# Patient Record
Sex: Female | Born: 1955 | Race: White | Hispanic: No | Marital: Married | Smoking: Current every day smoker
Health system: Southern US, Community
[De-identification: ages and names within clinical notes are randomized; demographics above are authoritative.]

## PROBLEM LIST (undated history)

## (undated) DIAGNOSIS — B178 Other specified acute viral hepatitis: Secondary | ICD-10-CM

## (undated) DIAGNOSIS — B2799 Infectious mononucleosis, unspecified with other complication: Secondary | ICD-10-CM

## (undated) DIAGNOSIS — Z8669 Personal history of other diseases of the nervous system and sense organs: Secondary | ICD-10-CM

## (undated) DIAGNOSIS — N809 Endometriosis, unspecified: Secondary | ICD-10-CM

## (undated) DIAGNOSIS — F1121 Opioid dependence, in remission: Secondary | ICD-10-CM

## (undated) HISTORY — DX: Other specified acute viral hepatitis: B17.8

## (undated) HISTORY — PX: BUNIONECTOMY: SHX129

## (undated) HISTORY — DX: Personal history of other diseases of the nervous system and sense organs: Z86.69

## (undated) HISTORY — PX: ABDOMINAL HYSTERECTOMY: SHX81

## (undated) HISTORY — DX: Other specified acute viral hepatitis: B27.99

## (undated) HISTORY — PX: CHOLECYSTECTOMY: SHX55

## (undated) HISTORY — DX: Endometriosis, unspecified: N80.9

## (undated) HISTORY — DX: Opioid dependence, in remission: F11.21

---

## 1997-07-21 ENCOUNTER — Inpatient Hospital Stay (HOSPITAL_COMMUNITY): Admission: EM | Admit: 1997-07-21 | Discharge: 1997-07-23 | Payer: Self-pay | Admitting: *Deleted

## 1997-08-14 ENCOUNTER — Emergency Department (HOSPITAL_COMMUNITY): Admission: EM | Admit: 1997-08-14 | Discharge: 1997-08-14 | Payer: Self-pay

## 1997-08-17 ENCOUNTER — Encounter: Admission: RE | Admit: 1997-08-17 | Discharge: 1997-11-15 | Payer: Self-pay | Admitting: Internal Medicine

## 1997-09-10 ENCOUNTER — Emergency Department (HOSPITAL_COMMUNITY): Admission: EM | Admit: 1997-09-10 | Discharge: 1997-09-10 | Payer: Self-pay | Admitting: Emergency Medicine

## 1997-11-23 ENCOUNTER — Encounter: Admission: RE | Admit: 1997-11-23 | Discharge: 1998-02-21 | Payer: Self-pay | Admitting: Internal Medicine

## 1997-11-30 ENCOUNTER — Encounter: Admission: RE | Admit: 1997-11-30 | Discharge: 1998-02-28 | Payer: Self-pay | Admitting: Internal Medicine

## 1998-01-06 ENCOUNTER — Encounter: Payer: Self-pay | Admitting: Gastroenterology

## 1998-01-06 ENCOUNTER — Inpatient Hospital Stay (HOSPITAL_COMMUNITY): Admission: EM | Admit: 1998-01-06 | Discharge: 1998-01-10 | Payer: Self-pay | Admitting: Gastroenterology

## 1998-01-07 ENCOUNTER — Encounter: Payer: Self-pay | Admitting: Gastroenterology

## 1998-01-08 ENCOUNTER — Encounter: Payer: Self-pay | Admitting: Gynecology

## 1998-02-01 ENCOUNTER — Ambulatory Visit (HOSPITAL_COMMUNITY): Admission: RE | Admit: 1998-02-01 | Discharge: 1998-02-01 | Payer: Self-pay | Admitting: Gynecology

## 1998-03-22 ENCOUNTER — Encounter: Payer: Self-pay | Admitting: Emergency Medicine

## 1998-03-22 ENCOUNTER — Emergency Department (HOSPITAL_COMMUNITY): Admission: EM | Admit: 1998-03-22 | Discharge: 1998-03-22 | Payer: Self-pay | Admitting: Emergency Medicine

## 1998-04-22 ENCOUNTER — Encounter: Admission: RE | Admit: 1998-04-22 | Discharge: 1998-05-06 | Payer: Self-pay | Admitting: *Deleted

## 1998-06-20 ENCOUNTER — Emergency Department (HOSPITAL_COMMUNITY): Admission: EM | Admit: 1998-06-20 | Discharge: 1998-06-20 | Payer: Self-pay | Admitting: Internal Medicine

## 1998-07-28 ENCOUNTER — Emergency Department (HOSPITAL_COMMUNITY): Admission: EM | Admit: 1998-07-28 | Discharge: 1998-07-28 | Payer: Self-pay | Admitting: Emergency Medicine

## 1998-07-28 ENCOUNTER — Encounter: Payer: Self-pay | Admitting: Emergency Medicine

## 1998-10-18 ENCOUNTER — Inpatient Hospital Stay (HOSPITAL_COMMUNITY): Admission: EM | Admit: 1998-10-18 | Discharge: 1998-10-22 | Payer: Self-pay | Admitting: Gastroenterology

## 1998-10-19 ENCOUNTER — Encounter: Payer: Self-pay | Admitting: Gastroenterology

## 1998-10-22 ENCOUNTER — Encounter: Payer: Self-pay | Admitting: Gastroenterology

## 1998-12-07 ENCOUNTER — Encounter (INDEPENDENT_AMBULATORY_CARE_PROVIDER_SITE_OTHER): Payer: Self-pay

## 1998-12-07 ENCOUNTER — Inpatient Hospital Stay (HOSPITAL_COMMUNITY): Admission: RE | Admit: 1998-12-07 | Discharge: 1998-12-09 | Payer: Self-pay | Admitting: *Deleted

## 1999-10-20 ENCOUNTER — Encounter: Admission: RE | Admit: 1999-10-20 | Discharge: 1999-10-20 | Payer: Self-pay | Admitting: *Deleted

## 1999-10-20 ENCOUNTER — Encounter: Payer: Self-pay | Admitting: *Deleted

## 1999-12-02 ENCOUNTER — Encounter: Admission: RE | Admit: 1999-12-02 | Discharge: 1999-12-02 | Payer: Self-pay | Admitting: Family Medicine

## 1999-12-02 ENCOUNTER — Encounter: Payer: Self-pay | Admitting: Family Medicine

## 1999-12-16 ENCOUNTER — Encounter: Payer: Self-pay | Admitting: Gastroenterology

## 1999-12-16 ENCOUNTER — Ambulatory Visit (HOSPITAL_COMMUNITY): Admission: RE | Admit: 1999-12-16 | Discharge: 1999-12-16 | Payer: Self-pay | Admitting: Gastroenterology

## 1999-12-22 ENCOUNTER — Ambulatory Visit (HOSPITAL_COMMUNITY): Admission: RE | Admit: 1999-12-22 | Discharge: 1999-12-22 | Payer: Self-pay | Admitting: Gastroenterology

## 1999-12-22 ENCOUNTER — Encounter: Payer: Self-pay | Admitting: Gastroenterology

## 2000-01-05 ENCOUNTER — Encounter: Payer: Self-pay | Admitting: General Surgery

## 2000-01-05 ENCOUNTER — Encounter (INDEPENDENT_AMBULATORY_CARE_PROVIDER_SITE_OTHER): Payer: Self-pay | Admitting: Specialist

## 2000-01-05 ENCOUNTER — Observation Stay (HOSPITAL_COMMUNITY): Admission: RE | Admit: 2000-01-05 | Discharge: 2000-01-06 | Payer: Self-pay | Admitting: General Surgery

## 2000-03-13 HISTORY — PX: TOTAL VAGINAL HYSTERECTOMY: SHX2548

## 2001-03-08 ENCOUNTER — Inpatient Hospital Stay (HOSPITAL_COMMUNITY): Admission: AD | Admit: 2001-03-08 | Discharge: 2001-03-11 | Payer: Self-pay | Admitting: Psychiatry

## 2001-03-14 ENCOUNTER — Other Ambulatory Visit (HOSPITAL_COMMUNITY): Admission: RE | Admit: 2001-03-14 | Discharge: 2001-04-09 | Payer: Self-pay | Admitting: Psychiatry

## 2001-06-21 ENCOUNTER — Emergency Department (HOSPITAL_COMMUNITY): Admission: EM | Admit: 2001-06-21 | Discharge: 2001-06-21 | Payer: Self-pay | Admitting: *Deleted

## 2001-06-21 ENCOUNTER — Other Ambulatory Visit (HOSPITAL_COMMUNITY): Admission: RE | Admit: 2001-06-21 | Discharge: 2001-07-19 | Payer: Self-pay | Admitting: Psychiatry

## 2001-10-07 ENCOUNTER — Encounter: Admission: RE | Admit: 2001-10-07 | Discharge: 2001-10-07 | Payer: Self-pay | Admitting: Psychiatry

## 2001-10-15 ENCOUNTER — Encounter: Admission: RE | Admit: 2001-10-15 | Discharge: 2001-10-15 | Payer: Self-pay | Admitting: Psychiatry

## 2001-10-29 ENCOUNTER — Encounter: Admission: RE | Admit: 2001-10-29 | Discharge: 2001-10-29 | Payer: Self-pay | Admitting: Psychiatry

## 2001-11-08 ENCOUNTER — Encounter: Admission: RE | Admit: 2001-11-08 | Discharge: 2001-11-08 | Payer: Self-pay | Admitting: *Deleted

## 2001-11-12 ENCOUNTER — Encounter: Admission: RE | Admit: 2001-11-12 | Discharge: 2001-11-12 | Payer: Self-pay | Admitting: Psychiatry

## 2001-11-19 ENCOUNTER — Encounter: Admission: RE | Admit: 2001-11-19 | Discharge: 2001-11-19 | Payer: Self-pay | Admitting: Psychiatry

## 2001-11-26 ENCOUNTER — Encounter: Admission: RE | Admit: 2001-11-26 | Discharge: 2001-11-26 | Payer: Self-pay | Admitting: Psychiatry

## 2001-12-06 ENCOUNTER — Encounter: Admission: RE | Admit: 2001-12-06 | Discharge: 2001-12-06 | Payer: Self-pay | Admitting: Psychiatry

## 2001-12-11 ENCOUNTER — Encounter: Admission: RE | Admit: 2001-12-11 | Discharge: 2001-12-11 | Payer: Self-pay | Admitting: Psychiatry

## 2001-12-17 ENCOUNTER — Encounter: Admission: RE | Admit: 2001-12-17 | Discharge: 2001-12-17 | Payer: Self-pay | Admitting: Psychiatry

## 2005-11-14 ENCOUNTER — Emergency Department (HOSPITAL_COMMUNITY): Admission: EM | Admit: 2005-11-14 | Discharge: 2005-11-14 | Payer: Self-pay | Admitting: Emergency Medicine

## 2005-11-20 ENCOUNTER — Other Ambulatory Visit (HOSPITAL_COMMUNITY): Admission: RE | Admit: 2005-11-20 | Discharge: 2006-02-18 | Payer: Self-pay | Admitting: Psychiatry

## 2005-11-21 ENCOUNTER — Ambulatory Visit: Payer: Self-pay | Admitting: Psychiatry

## 2009-04-27 ENCOUNTER — Ambulatory Visit: Payer: Self-pay | Admitting: Internal Medicine

## 2009-04-27 DIAGNOSIS — F17200 Nicotine dependence, unspecified, uncomplicated: Secondary | ICD-10-CM | POA: Insufficient documentation

## 2009-04-27 DIAGNOSIS — R109 Unspecified abdominal pain: Secondary | ICD-10-CM | POA: Insufficient documentation

## 2009-04-27 DIAGNOSIS — R030 Elevated blood-pressure reading, without diagnosis of hypertension: Secondary | ICD-10-CM | POA: Insufficient documentation

## 2009-06-01 ENCOUNTER — Ambulatory Visit: Payer: Self-pay | Admitting: Internal Medicine

## 2009-06-01 LAB — CONVERTED CEMR LAB
ALT: 25 units/L (ref 0–35)
Albumin: 4.1 g/dL (ref 3.5–5.2)
BUN: 8 mg/dL (ref 6–23)
Basophils Relative: 1.3 % (ref 0.0–3.0)
Bilirubin Urine: NEGATIVE
Chloride: 110 meq/L (ref 96–112)
Cholesterol: 190 mg/dL (ref 0–200)
Eosinophils Relative: 5.3 % — ABNORMAL HIGH (ref 0.0–5.0)
Glucose, Urine, Semiquant: NEGATIVE
HCT: 40.4 % (ref 36.0–46.0)
Hemoglobin: 13.4 g/dL (ref 12.0–15.0)
LDL Cholesterol: 124 mg/dL — ABNORMAL HIGH (ref 0–99)
Lymphs Abs: 1.7 10*3/uL (ref 0.7–4.0)
MCV: 96.2 fL (ref 78.0–100.0)
Monocytes Absolute: 0.3 10*3/uL (ref 0.1–1.0)
Neutro Abs: 2 10*3/uL (ref 1.4–7.7)
Platelets: 164 10*3/uL (ref 150.0–400.0)
Potassium: 4.2 meq/L (ref 3.5–5.1)
RBC: 4.2 M/uL (ref 3.87–5.11)
TSH: 1.22 microintl units/mL (ref 0.35–5.50)
Total Protein: 6.7 g/dL (ref 6.0–8.3)
Urobilinogen, UA: 0.2
WBC: 4.3 10*3/uL — ABNORMAL LOW (ref 4.5–10.5)

## 2009-06-09 ENCOUNTER — Ambulatory Visit: Payer: Self-pay | Admitting: Internal Medicine

## 2009-06-09 DIAGNOSIS — N951 Menopausal and female climacteric states: Secondary | ICD-10-CM | POA: Insufficient documentation

## 2009-06-09 DIAGNOSIS — M5430 Sciatica, unspecified side: Secondary | ICD-10-CM | POA: Insufficient documentation

## 2009-07-26 ENCOUNTER — Telehealth: Payer: Self-pay | Admitting: Internal Medicine

## 2009-09-10 ENCOUNTER — Ambulatory Visit: Payer: Self-pay | Admitting: Internal Medicine

## 2009-11-26 ENCOUNTER — Telehealth: Payer: Self-pay | Admitting: Internal Medicine

## 2009-11-30 ENCOUNTER — Telehealth (INDEPENDENT_AMBULATORY_CARE_PROVIDER_SITE_OTHER): Payer: Self-pay | Admitting: *Deleted

## 2009-12-09 ENCOUNTER — Telehealth: Payer: Self-pay | Admitting: *Deleted

## 2010-01-07 ENCOUNTER — Telehealth: Payer: Self-pay | Admitting: Internal Medicine

## 2010-01-07 ENCOUNTER — Telehealth: Payer: Self-pay | Admitting: *Deleted

## 2010-02-11 ENCOUNTER — Telehealth: Payer: Self-pay | Admitting: *Deleted

## 2010-04-03 ENCOUNTER — Encounter: Payer: Self-pay | Admitting: Emergency Medicine

## 2010-04-12 NOTE — Progress Notes (Signed)
Summary: Pt does not want anymore narcotics. Pls call  Phone Note Call from Patient Call back at Home Phone [phone]   Caller: Patient Summary of Call: Pt called and said that she believes that she is hooked on narcotics and wants it noted in her chart, not to give her anymore narcotics. Pt would like Dr [name] to call.  Initial call taken by: [name],  February 11, 2010 9:52 AM  Follow-up for Phone Call        please call patient    i wont be able to call her today .   ok to note in chart   her request .  Should probably come in to discuss   her medication needs  in the next few week s   and plans ( see my last   advice about assessment  and follow up.)   otherwise  Follow-up by: Madelin Headings MD,  February 11, 2010 1:02 PM  Additional Follow-up for Phone Call Additional follow up Details #1::        Pt aware and will call back to schedule an appt. Additional Follow-up by: Romualdo Bolk, CMA (AAMA),  February 14, 2010 10:30 AM     Appended Document: Pt does not want anymore narcotics. Pls call    Clinical Lists Changes  Allergies: Added new allergy or adverse reaction of * NARCOTICS

## 2010-04-12 NOTE — Progress Notes (Signed)
Summary: please verify patch  Phone Note From Pharmacy   [facility]  [address] [phone] Summary of Call: Estradiol patch was e- prescribed apply patch 2 times a wk instead of one time a week. Please verify Initial call taken by: Heron Sabins,  January 07, 2010 3:02 PM  Follow-up for Phone Call        I called pharmacy and told them that per Dr. [name]- it is twice a week. Follow-up by: Romualdo Bolk, CMA (AAMA),  January 07, 2010 3:21 PM

## 2010-04-12 NOTE — Assessment & Plan Note (Signed)
Summary: CPX/SSC   Vital Signs:  Patient profile:   55 year old female Menstrual status:  hysterectomy Height:      63.5 inches Weight:      149 pounds Pulse rate:   100 / minute BP sitting:   120 / 80  (left arm) Cuff size:   regular  Vitals Entered By: Romualdo Bolk, CMA (AAMA) (June 09, 2009 9:53 AM) CC: CPX   History of Present Illness: Amanda [name] comes  in for her preventive visit . No change since last time .   Has cut down on her tobacco.   Gi taking pepcid    once a day  and helps  her stomach doing well.  BP 120 130    doing much better  and no se of meds. Has hot flushes over the last months   and is problematic and asks  about best rx .  No hx of clots  or heart  signs   has problems with sciatica left back and flares off na on has had oxycodone as needed 10 that she takes infrequently( one refill lasts over a year)   Preventive Care Screening  Prior Values:    Pap Smear:  normal (03/14/2007)    Mammogram:  normal (03/14/2007)    Colonoscopy:  normal (03/13/2000)    Last Tetanus Booster:  Historical (03/14/2007)   Preventive Screening-Counseling & Management  Alcohol-Tobacco     Alcohol drinks/day: 0     Smoking Status: current     Smoking Cessation Counseling: YES     Smoke Cessation Stage: ready     Packs/Day: <0.25  Caffeine-Diet-Exercise     Caffeine use/day: 4-5     Does Patient Exercise: no  Hep-HIV-STD-Contraception     Dental Visit-last 6 months yes     Sun Exposure-Excessive: no  Safety-Violence-Falls     Seat Belt Use: yes     Firearms in the Home: no firearms in the home     Smoke Detectors: yes  EKG  Procedure date:  06/09/2009  Findings:      Normal sinus rhythm with rate of:  66  Current Medications (verified): 1)  Lisinopril 10 Mg Tabs (Lisinopril) .Marland Kitchen.. 1 By Mouth Once Daily  Allergies (verified): 1)  ! Penicillin 2)  Lipitor  Past History:  Past medical, surgical, family and social histories (including risk  factors) reviewed, and no changes noted (except as noted below).  Past Medical History: G2P2  Mono hepatitis Colon 2002   Endometriosis Sciatica left   Past Surgical History: Reviewed history from 04/27/2009 and no changes required. Cholecystectomy Hysterectomy  endometriosis   right oopherectomy   Past History:  Care Management: Gastroenterology: Russella Dar  Family History: Reviewed history from 04/27/2009 and no changes required. Father: Kidney failure, HBP.    vascular disease Mother: Heart disease, DM, heart attack early 66's, HBP, high cholesterol Siblings: Brothers- 1- Chron's and DM MGM  ; Mi 46   Social History: Reviewed history from 04/27/2009 and no changes required. Retired  hhof 4   Occupation: Part-time  BFA Married Current Smoker- 1 pack q 2-4 weeks Alcohol use-no Drug use-no Regular exercise-no  Review of Systems  The patient denies anorexia, fever, weight loss, weight gain, vision loss, decreased hearing, hoarseness, chest pain, syncope, dyspnea on exertion, peripheral edema, headaches, hemoptysis, melena, hematochezia, severe indigestion/heartburn, hematuria, muscle weakness, transient blindness, difficulty walking, depression, unusual weight change, abnormal bleeding, enlarged lymph nodes, angioedema, and breast masses.   Physical Exam General Appearance: well  developed, well nourished, no acute distress Eyes: conjunctiva and lids normal, PERRLA, EOMI,  WNL Ears, Nose, Mouth, Throat: TM clear, nares clear, oral exam WNL Neck: supple, no lymphadenopathy, no thyromegaly, no JVD Respiratory: clear to auscultation and percussion, respiratory effort normal Cardiovascular: regular rate and rhythm, S1-S2, no murmur, rub or gallop, no bruits, peripheral pulses normal and symmetric, no cyanosis, clubbing, edema or varicosities Chest: no scars, masses, tenderness; no asymmetry, skin changes, nipple discharge   Gastrointestinal: soft, non-tender; no  hepatosplenomegaly, masses; active bowel sounds all quadrants,  Genitourinary: hysterectomy Lymphatic: no cervical, axillary or inguinal adenopathy Musculoskeletal: gait normal, muscle tone and strength WNL, no joint swelling, effusions, discoloration, crepitus  some left lbp on heel walking Skin: clear, good turgor, color WNL, no rashes, lesions, or ulcerations Neurologic: normal mental status, normal reflexes, normal strength, sensation, and motion Psychiatric: alert; oriented to person, place and time Other Exam:  EKD nl   see labs     Impression & Recommendations:  Problem # 1:  HEALTH MAINTENANCE EXAM, ADULT (ICD-V70.0)  counseled  about lipids  and lifestyle intervention for cv  risk reduction   Orders: EKG w/ Interpretation (93000)  Problem # 2:  TOBACCO USER (ICD-305.1) Assessment: Improved  encouraged cessation  Encouraged smoking cessation and discussed different methods for smoking cessation.   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 further eval if persistent and progressive  . Her updated medication list for this problem includes:    Hydrocodone-acetaminophen 10-325 Mg/30ml Soln (Hydrocodone-acetaminophen) .Marland Kitchen... 1 by mouth q4-6 hours  as needed severe pain    Hydrocodone-acetaminophen 10-325 Mg Tabs (Hydrocodone-acetaminophen) .Marland Kitchen... 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 Apply patch 2 x per week.  Discussed treatment options.   risk benefit. needs to stop smoking.   Problem # 5:  CORONARY ARTERY DISEASE, FAMILY HX (ICD-V17.3) Assessment: Comment Only lipids are acceptable  but could be better   wprefers no meds  .  stop tobacco  Orders: EKG w/ Interpretation (93000)  Complete Medication List: 1)   Lisinopril 10 Mg Tabs (Lisinopril) .Marland Kitchen.. 1 by mouth once daily 2)  Estraderm 0.05 Mg/24hr Pttw (Estradiol) .... Apply patch 2 x per week. 3)  Hydrocodone-acetaminophen 10-325 Mg/85ml Soln (Hydrocodone-acetaminophen) .Marland Kitchen.. 1 by mouth q4-6 hours  as needed severe pain 4)  Hydrocodone-acetaminophen 10-325 Mg Tabs (Hydrocodone-acetaminophen) .Marland Kitchen.. 1 by mouth q4-6 hours as needed for pain  Patient Instructions: 1)  stop tobacco as we discussed   2)  this is the most effective  way to reduce your cardiovascular risk. 3)  Your lipids are acceptable .  but could be better  .   4)  Stopping tobacco may help. 5)  HRT can help your hot flushes but can increase risk of breast cancer and CV  events very  slightly. 6)  return office visit  in 3 months or prn Prescriptions: HYDROCODONE-ACETAMINOPHEN 10-325 MG TABS (HYDROCODONE-ACETAMINOPHEN) 1 by mouth q4-6 hours as needed for pain  #30 x 0   Entered and Authorized by:   Madelin Headings MD   Signed by:   Madelin Headings MD on 06/09/2009   Method used:   Print then Give to Patient   RxID:   [id number] HYDROCODONE-ACETAMINOPHEN 10-325 MG/15ML SOLN (HYDROCODONE-ACETAMINOPHEN) 1 by mouth q4-6  hours  as needed severe pain  #30 x 0   Entered and Authorized by:   Madelin Headings MD   Signed by:   Madelin Headings MD on 06/09/2009   Method used:   Print then Give to Patient   RxID:   [id number] ESTRADERM 0.05 MG/24HR PTTW (ESTRADIOL) apply patch 2 x per week.  #8 x 5   Entered and Authorized by:   Madelin Headings MD   Signed by:   Madelin Headings MD on 06/09/2009   Method used:   [facility]        [facility] [phone]* (retail)       [address]       [place], [place]  [address]       Ph: [phone] or [phone]       Fax: [phone]   RxID:   [id number]   the above solution rx was an error and was voided.  WKP.

## 2010-04-12 NOTE — Assessment & Plan Note (Signed)
Summary: NEW TO ESTABLISH//SLM   Vital Signs:  Patient profile:   55 year old female Menstrual status:  hysterectomy Height:      63.75 inches Weight:      152 pounds BMI:     26.39 Pulse rate:   84 / minute BP sitting:   180 / 90  (left arm) Cuff size:   regular  Vitals Entered By: Romualdo Bolk, CMA (AAMA) (April 27, 2009 9:23 AM)  Serial Vital Signs/Assessments:  Time      Position  BP       Pulse  Resp  Temp     By           R Arm     170/80                         Madelin Headings MD           L Arm     174/84                         Madelin Headings MD  Comments: reg cuff sitting By: Madelin Headings MD   CC: New Pt to establish- Discuss bp and Pt is having rt sided pain that happens when she eats but also happens at other times as well. It more of a stabbing/ burning pain. It can move to the left side.      Menstrual Status hysterectomy Last PAP Result normal   History of Present Illness: Amanda [name] comesin today for   as a new patient.  She needs PCP .  She was  last seen 2009  Dr Artis Flock. Not on any medication but has seen urgent care [facility]. Bp elevated  there  .   Has arm   Machine checked  out with husbands visit.  She brings in a log o her BP readings over the last few months.  Mostly normal but a few readings elevated. Has never been on BP meds.  Tobacco :  off an on since college   quit for 3 years aroung pregnancy but smoking small amts recently  . No hx of asthma or COPD. or chronic cough  Lipitor use  in the past    has myalgias with this  in the remote past . ? when last checked lipid levels. She has no Cv symptom .   Preventive Care Screening  Last Tetanus Booster:    Date:  03/14/2007    Results:  Historical   Mammogram:    Date:  03/14/2007    Results:  normal   Pap Smear:    Date:  03/14/2007    Results:  normal   Colonoscopy:    Date:  03/13/2000    Results:  normal    Preventive Screening-Counseling &  Management  Alcohol-Tobacco     Alcohol drinks/day: 0     Smoking Status: current     Smoking Cessation Counseling: YES     Packs/Day: <0.25  Caffeine-Diet-Exercise     Caffeine use/day: 4-5     Does Patient Exercise: no  Hep-HIV-STD-Contraception     Dental Visit-last 6 months yes     Sun Exposure-Excessive: no  Safety-Violence-Falls     Seat Belt Use: yes     Firearms in the Home: no firearms in the home     Smoke Detectors: yes      Drug Use:  no.  Current Medications (verified): 1)  None  Allergies (verified): 1)  ! Penicillin 2)  Lipitor  Past History:  Past Medical History: G2P2  Mono hepatitis Colon 2002   Endometriosis  Past Surgical History: Cholecystectomy Hysterectomy  endometriosis   right oopherectomy   Past History:  Care Management: Gastroenterology: Russella Dar  Family History: Father: Kidney failure, HBP.    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    Physical Exam  General:  Well-developed,well-nourished,in no acute distress;  alert,appropriate and cooperative throughout examination Head:  normocephalic, atraumatic, and no abnormalities observed.   Eyes:  vision grossly intact, pupils equal, and pupils round.   Ears:  no external deformities.   Nose:  no external deformity and no external erythema.   Mouth:  good dentition and pharynx pink and moist.   Neck:  No deformities, masses, or tenderness noted. Lungs:  Normal respiratory effort, chest expands symmetrically. Lungs are clear to auscultation, no crackles or wheezes. Heart:  Normal rate and regular rhythm. S1 and S2 normal without gallop, murmur, click, rub or other extra sounds.no lifts.   Abdomen:  Bowel sounds positive,abdomen soft and non-tender without masses, organomegaly or  noted. Msk:  no joint warmth and no redness over joints.   Pulses:  pulses intact without delay   Extremities:  no clubbing cyanosis or edema  Neurologic:  alert & oriented X3 and gait normal.  non focal  Skin:  turgor normal, color normal, no ecchymoses, and no petechiae.   Cervical Nodes:  No lymphadenopathy noted Psych:  Oriented X3, normally interactive, good eye contact, not anxious appearing, and not depressed appearing.   reviewed   Bp log from JUne to  February  one reading 170,  6  readings in 140s and  21  less than 140 systolic   Impression & Recommendations:  Problem # 1:  ELEVATED BLOOD PRESSURE (ICD-796.2) seems to be normal most of the time at home with machine that has been checked as accurate.  ? WCHT but very stong fam hx of HT and end organ disease. counseling about lifestyle intervention  to reduce risk. Her updated medication list for this problem includes:    Lisinopril 10 Mg Tabs (Lisinopril) .Marland Kitchen... 1 by mouth once daily  Problem # 2:  ABDOMINAL PAIN OTHER SPECIFIED SITE (ICD-789.09) episodic and non specific  poss acid peptic related .    will use h2 antagonist for now and follow up .  Problem # 3:  CORONARY ARTERY DISEASE, FAMILY HX (ICD-V17.3) need  risk  assess ment ... impressive family hx    females with heart events in middle age.   Problem # 4:  TOBACCO USER (ICD-305.1) counseled about  d/c and  importance to decrease risk. will readdress in future visits also  Problem # 5:  Preventive Health Care (ICD-V70.0) get mammo   last colon 2002   will eventually need  another .      Problem # 6:  ? of HYPERLIPIDEMIA (ICD-272.4) was on liptor at one time and had se   Complete Medication List: 1)  Lisinopril 10 Mg Tabs (Lisinopril) .Marland Kitchen.. 1 by mouth once daily  Patient Instructions: 1)  Get copy of blood tests done in the last t year . 2)  also bring in Dr Artis Flock record  . 3)  Check Bp readings at least 5 x per week. 4)  Begin lisinopril 10 mg each day  this is a low dose med.  5)  Schedule fasting labs cpx .   6)  then cpx in 1 month ( can make a slot) . 7)  Can take zantac two times a day or pepcid for 2 weeks .  8)  Tobacco is very bad for your health and your loved ones ! You should stop smoking !  9)  lmimt sodium  increase aerobic exercise.  Prescriptions: LISINOPRIL 10 MG TABS (LISINOPRIL) 1 by mouth once daily  #30 x 3   Entered and Authorized by:   Madelin Headings MD   Signed by:   Madelin Headings MD on 04/27/2009   Method used:   [facility]        [facility]  [address] [facility] [address] [phone]* (retail)       [address]       [place], [place]  [address]       Ph: [phone] or [phone]       Fax: [phone]   RxID:   [id number]

## 2010-04-12 NOTE — Assessment & Plan Note (Signed)
Summary: FOLLOW UP/CB   Vital Signs:  Patient profile:   55 year old female Menstrual status:  hysterectomy Weight:      143 pounds Pulse rate:   66 / minute BP sitting:   120 / 70  (left arm) Cuff size:   regular  Vitals Entered By: Romualdo Bolk, CMA (AAMA) (September 10, 2009 10:39 AM) CC: Follow-up visit    History of Present Illness: Amanda [name] comes in for follow up of a couple of issues  and medications.  Hot flushes :  Much better on hormone replacement patch  Tobacco: down to one a day .Marland Kitchen.for a while  and exercising more and able to lose some weight. Pain:  sciatica:   left  med at beach .  and coulnt recover . asks for refill. takes intermittently .  BP doing much better with lifestyle intervention and medication without se. at goal   Preventive Screening-Counseling & Management  Alcohol-Tobacco     Alcohol drinks/day: 0     Smoking Status: current     Smoking Cessation Counseling: YES     Smoke Cessation Stage: ready     Packs/Day: <0.25     Tobacco Counseling: to quit use of tobacco products  Caffeine-Diet-Exercise     Caffeine use/day: 4-5     Does Patient Exercise: no  Comments: encouraged to keep going with quitting tobacco  only on one a day for weeks.  Safety-Violence-Falls     Firearms in the Home: firearms in the home     Firearm Counseling: not indicated; uses recommended firearm safety measures  Comments: hysband in law enforcement  Current Medications (verified): 1)  Lisinopril 10 Mg Tabs (Lisinopril) .Marland Kitchen.. 1 By Mouth Once Daily 2)  Estraderm 0.05 Mg/24hr Pttw (Estradiol) .... Apply Patch 2 X Per Week. 3)  Hydrocodone-Acetaminophen 10-325 Mg Tabs (Hydrocodone-Acetaminophen) .Marland Kitchen.. 1 By Mouth Q4-6 Hours As Needed For Pain  Allergies (verified): 1)  ! Penicillin 2)  Lipitor  Past History:  Past medical, surgical, family and social histories (including risk factors) reviewed, and no changes noted (except as noted below).  Past Medical  History: Reviewed history from 06/09/2009 and no changes required. G2P2  Mono hepatitis Colon 2002   Endometriosis Sciatica left   Past Surgical History: Reviewed history from 04/27/2009 and no changes required. Cholecystectomy Hysterectomy  endometriosis   right oopherectomy   Family History: Reviewed history from 04/27/2009 and no changes required. Father: Kidney failure, HBP.    vascular disease Mother: Heart disease, DM, heart attack early 85's, HBP, high cholesterol Siblings: Brothers- 1- Chron's and DM MGM  ; Mi 19   Social History: Reviewed history from 04/27/2009 and no changes required. Retired  hhof 4   Occupation: Part-time  BFA Married Current Smoker- 1 pack q 2-4 weeks Alcohol use-no Drug use-no Regular exercise-no  Review of Systems  The patient denies anorexia, fever, weight gain, vision loss, chest pain, syncope, dyspnea on exertion, peripheral edema, prolonged cough, hemoptysis, melena, hematochezia, muscle weakness, transient blindness, difficulty walking, depression, abnormal bleeding, enlarged lymph nodes, angioedema, and breast masses.         neg cv pulm , intermittent  swelling behind right knee  no calf swelling remote hx of injury when younger  Physical Exam  General:  Well-developed,well-nourished,in no acute distress; alert,appropriate and cooperative throughout examination Head:  normocephalic, atraumatic, and no abnormalities observed.   Eyes:  vision grossly intact and pupils equal.   Neck:  No deformities, masses, or tenderness noted. Lungs:  Normal respiratory effort, chest expands symmetrically. Lungs are clear to auscultation, no crackles or wheezes. Heart:  Normal rate and regular rhythm. S1 and S2 normal without gallop, murmur, click, rub or other extra sounds.no lifts.   Abdomen:  Bowel sounds positive,abdomen soft and non-tender without masses, organomegaly or  noted. Msk:  swelling behind right kknee in popliteal fossa  no calf  swekling redness  no knee instability obvious today  Pulses:  pulses intact without delay   Extremities:  no clubbing cyanosis or edema  Neurologic:  grossly non focal  Skin:  turgor normal, color normal, no ecchymoses, and no petechiae.   Cervical Nodes:  No lymphadenopathy noted Psych:  Oriented X3, good eye contact, not anxious appearing, and not depressed appearing.     Impression & Recommendations:  Problem # 1:  ELEVATED BLOOD PRESSURE (ICD-796.2) Assessment Improved on meds  Her updated medication list for this problem includes:    Lisinopril 10 Mg Tabs (Lisinopril) .Marland Kitchen... 1 by mouth once daily  BP today: 120/70 Prior BP: 120/80 (06/09/2009)  Labs Reviewed: Creat: 0.8 (06/01/2009) Chol: 190 (06/01/2009)   HDL: 51.40 (06/01/2009)   LDL: 124 (06/01/2009)   TG: 72.0 (06/01/2009)  Instructed in low sodium diet (DASH Handout) and behavior modification.    Problem # 2:  TOBACCO USER (ICD-305.1) down to 1 per day for at least 2 weeks.   continue    doing very well .   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 Apply patch 2 x per week.  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  The following medications were removed from the medication list:    Hydrocodone-acetaminophen 10-325 Mg/5ml Soln (Hydrocodone-acetaminophen) .Marland Kitchen... 1 by mouth q4-6 hours  as needed severe pain Her updated medication list for this problem includes:    Hydrocodone-acetaminophen 10-325 Mg Tabs (Hydrocodone-acetaminophen) .Marland Kitchen... 1 by mouth q4-6 hours as needed for pain  Problem # 5:  ? of POPLITEAL CYST, RIGHT (ICD-727.51) Expectant management may need to see ortho .  Complete Medication  List: 1)  Lisinopril 10 Mg Tabs (Lisinopril) .Marland Kitchen.. 1 by mouth once daily 2)  Estraderm 0.05 Mg/24hr Pttw (Estradiol) .... Apply patch 2 x per week. 3)  Hydrocodone-acetaminophen 10-325 Mg Tabs (Hydrocodone-acetaminophen) .Marland Kitchen.. 1 by mouth q4-6 hours as needed for pain  Patient Instructions: 1)  continue    medication 2)  continue tobacco   cessation.  3)  ROV  with cpx with labs   Feb 2012. 4)  If  swelling in   knee getting worse see ortho . this could be  Bakers Cyst. Prescriptions: HYDROCODONE-ACETAMINOPHEN 10-325 MG TABS (HYDROCODONE-ACETAMINOPHEN) 1 by mouth q4-6 hours as needed for pain  #30 x 1   Entered and Authorized by:   Madelin Headings MD   Signed by:   Madelin Headings MD on 09/10/2009   Method used:   Print then Give to Patient   RxID:   [id number]

## 2010-04-12 NOTE — Progress Notes (Signed)
Summary: refill on hydrocodone  Phone Note Call from Patient Call back at [phone]   Caller: Patient Summary of Call: Pt walked in the office this am stating that she left her hydrocodone at the beach. She is now leaving to go out of town this am and needs a refill on this now because she is going [place] of town to [place]. Pt would like to [facility]. Call pt once it has been called in. Initial call taken by: Romualdo Bolk, CMA (AAMA),  Jul 26, 2009 10:26 AM  Follow-up for Phone Call        Pt called in to adv that msg that she left earlier concerning a refill can be disregarded.  Follow-up by: Debbra Riding,  Jul 26, 2009 11:32 AM

## 2010-04-12 NOTE — Progress Notes (Signed)
Summary: Pt req refill of Hydrocodone to [facility] [address]  Phone Note Refill Request Call back at [facility] Phone [phone]   Refills Requested: Medication #1:  HYDROCODONE-ACETAMINOPHEN 10-325 MG TABS 1 by mouth q4-6 hours as needed for pain.   Dosage confirmed as above?Dosage Confirmed Pt in a lot of pain. Req early refill. Pls call in to [place] [address].       Method Requested: Telephone to Pharmacy Initial call taken by: [name],  December 09, 2009 10:45 AM  Follow-up for Phone Call        please assess her pain status and cause  .and   if getting worse then  may need OV.  ( we may need to  change her rx.  refill x 1  l Follow-up by: Madelin Headings MD,  December 09, 2009 11:42 AM  Additional Follow-up for Phone Call Additional follow up Details #1::        Spoke to pt and she states that the pain comes and goes. It is coming more frequent now. She hasn't seen ortho for this but will call them to see if they can get her in this week.  Rx called in for 40 tabs per Dr. [name] Additional Follow-up by: Romualdo Bolk, CMA [name],  December 09, 2009 12:00 PM    Prescriptions: HYDROCODONE-ACETAMINOPHEN 10-325 MG TABS (HYDROCODONE-ACETAMINOPHEN) 1 by mouth q4-6 hours as needed for pain  #40 x 0   Entered by:   Romualdo Bolk, CMA (AAMA)   Authorized by:   Madelin Headings MD   Signed by:   Romualdo Bolk, CMA (AAMA) on 12/09/2009   Method used:   Telephoned to ...       [facility] [address] [phone]* (retail)       [address]       [place], [place]  [address]       Ph: [phone] or [phone]       Fax: [phone]   RxID:   [id number]

## 2010-04-12 NOTE — Progress Notes (Signed)
Summary: Change Estraderm  Phone Note From Pharmacy   [facility]  [address] [phone]* Summary of Call: Pollie Meyer 0.05mg  is no longer made. Pharmacy is requesting a change to a different patch. Initial call taken by: Romualdo Bolk, CMA [name],  January 07, 2010 8:13 AM  Follow-up for Phone Call        ok to refill  with generic    estradiol patch .05   can refill until  due for check up in July  Follow-up by: Madelin Headings MD,  January 07, 2010 11:49 AM  Additional Follow-up for Phone Call Additional follow up Details #1::        Rx sent to pharmacy. Additional Follow-up by: Romualdo Bolk, CMA (AAMA),  January 07, 2010 12:43 PM    New/Updated Medications: ESTRADIOL 0.05 MG/24HR PTWK (ESTRADIOL) apply patch 2 times a week Prescriptions: ESTRADIOL 0.05 MG/24HR PTWK (ESTRADIOL) apply patch 2 times a week  #8 x 3   Entered by:   Romualdo Bolk, CMA (AAMA)   Authorized by:   Madelin Headings MD   Signed by:   Romualdo Bolk, CMA (AAMA) on 01/07/2010   Method used:   [facility]        [facility]  [address] [phone]* (retail)       [address]       [place], [place]  [address]       Ph: [phone] or [phone]       Fax: [phone]   RxID:   [id number]

## 2010-04-12 NOTE — Progress Notes (Signed)
Summary: hydroco refill/rx diff drugstore  Phone Note Call from Patient Call back at Home Phone [phone]   Call For: Madelin Headings MD Summary of Call: Refill pain med needed for travel to [facility] where cousinn has died auto accident.  Hydrocodone.  [facility] [address].  Need to leave ASAP.   Initial call taken by: Rudy Jew, RN,  November 26, 2009 9:15 AM  Follow-up for Phone Call        ok to refill x 1  Follow-up by: Madelin Headings MD,  November 26, 2009 11:08 AM  Additional Follow-up for Phone Call Additional follow up Details #1::        Phone Call Completed.  Patient called to check on this.   Refill x1 only called in.   Additional Follow-up by: Rudy Jew, RN,  November 26, 2009 11:30 AM    Prescriptions: HYDROCODONE-ACETAMINOPHEN 10-325 MG TABS (HYDROCODONE-ACETAMINOPHEN) 1 by mouth q4-6 hours as needed for pain  #30 x 0   Entered by:   Rudy Jew, RN   Authorized by:   Madelin Headings MD   Signed by:   Rudy Jew, RN on 11/26/2009   Method used:   Telephoned to ...       [facility] [address] [phone]* (retail)       [address]       [place], [place]  [address]       Ph: [phone] or [phone]       Fax: [phone]   RxID:   [id number] HYDROCODONE-ACETAMINOPHEN 10-325 MG TABS (HYDROCODONE-ACETAMINOPHEN) 1 by mouth q4-6 hours as needed for pain  #30 x 1   Entered by:   Rudy Jew, RN   Authorized by:   Madelin Headings MD   Signed by:   Rudy Jew, RN on 11/26/2009   Method used:   Telephoned to ...       [facility] [address] [phone]* (retail)       [address]       [place], [place]  [address]       Ph: [phone] or [phone]       Fax: [phone]   RxID:   [id number]

## 2010-04-12 NOTE — Progress Notes (Signed)
Summary: List of Blood Pressures taken at home  List of Blood Pressures taken at home   Imported By: Maryln Gottron 05/03/2009 15:08:39  _____________________________________________________________________  External Attachment:    Type:   Image     Comment:   External Document

## 2010-04-12 NOTE — Progress Notes (Signed)
Summary: needs refill  Phone Note Call from Patient Call back at Home Phone [phone]   Caller: Patient Call For: Madelin Headings MD Complaint: Urinary/GYN Problems Summary of Call: Pt stated she has no refills on hydrocodone please call pharm [facility] [address] Initial call taken by: Heron Sabins,  November 30, 2009 9:09 AM  Follow-up for Phone Call        Called pharmacy and pt picked up RX on 11/26/09. I informed patient and she states she wants the refill now and I informed her that she will need to wait for 1 month to get the refill. Follow-up by: Josph Macho RMA,  November 30, 2009 9:54 AM

## 2010-07-29 NOTE — Discharge Summary (Signed)
[facility]  Patient:    Amanda [name], Amanda [name] Visit Number: [id number] MRN: [id number]          Service Type: PSY Location: [facility] Attending Physician:  Rachael Fee Dictated by:   Jeanice Lim, M.D. Admit Date:  03/14/2001 Discharge Date: 04/09/2001                             Discharge Summary  IDENTIFYING DATA:  This is a 55 year old Caucasian female voluntarily admitted requesting detox from opiates, unable to stop Tussionex as an outpatient, has a history of 7 years of sobriety and a 2 year period of escalating dose of Vicodin and Tussionex.  No past psychiatric history.  MEDICATIONS:  None.  DRUG ALLERGIES:  PENICILLIN.  PHYSICAL EXAMINATION:  Unremarkable.  NEUROLOGICAL:  Neurologically within normal limits.  MENTAL STATUS EXAM:  Fully alert, cooperative female.  Speech within normal limits.  Mood mildly anxious.  Affect restricted.  Thought processes goal directed.  Thought content negative for suicidal or homicidal ideation or psychotic symptoms.  Cognitively intact.  ADMITTING DIAGNOSES: Axis I:    Opiate dependence. Axis II:   None. Axis III:  None. Axis IV:   Moderate problems of primary support. Axis V:    38/76.  HOSPITAL COURSE:  The patient was admitted for opiate detox, put on Wytensin detox protocol.  The patient tolerated initial protocol without difficulty and was agreeable for outpatient treatment for further observation, withdrawal and substance abuse treatment.  The patient initially felt cramping, anxiety and dysphoria, but tolerated detox without complications.  Her condition at discharge is markedly improved.  There is no evidence of acute withdrawal. Mood was euthymic.  Affect bright.  Thought processes goal directed.  Thought content negative for for dangerous ideation or psychotic symptoms.  The patient reportedly motivation to be compliant with substance abuse treatment and abstinence and was discharged on  Anaprox two q.12h. p.r.n. pain, to avoid all opiates and attend NA and follow up with [facility] on 12/31 at 4 p.m.  DISCHARGE DIAGNOSES: Axis I:    Opiate dependence. Axis II:   None. Axis III:  None. Axis IV:   Moderate problems of primary support. Axis V:    Global Assessment of Functioning on discharge was 50. Dictated by:   Jeanice Lim, M.D. Attending Physician:  Rachael Fee DD:  05/22/01 TD:  05/24/01 Job: [id number]

## 2010-07-29 NOTE — Op Note (Signed)
[facility]  Patient:    Amanda [name], Amanda [name]                      MRN: [id number] Proc. Date: 01/05/00 Adm. Date:  [id number] Attending:  Brandy Hale CC:         Venita Lick. Pleas Koch., M.D. [facility] C. Andrey Campanile, M.D.   Operative Report  PREOPERATIVE DIAGNOSIS:  Biliary colic, suspect cholecystitis.  POSTOPERATIVE DIAGNOSIS:  Biliary colic, suspect cholecystitis.  OPERATION:  Laparoscopic cholecystectomy with intraoperative cholangiogram.  SURGEON:  Angelia Mould. Derrell Lolling, M.D.  FIRST ASSISTANT:  Currie Paris, M.D.  OPERATIVE INDICATIONS:  This is a 55 year old white female who began having abdominal pain problems two years ago.  Since that time she has had intermittent episodes of severe right upper quadrant pain radiating to her back with nausea.  She has been hospitalized at least twice for this and had extensive workup, which has not been very revealing.  She did have an ovarian cyst.  She did have an abdominal hysterectomy and right salpingo-oophorectomy in September 2000, and she did well after that surgery with resolution of her pelvic pain, but her right upper quadrant pain has persisted.  For the past three weeks she has been having daily right upper quadrant pain radiating to her back, nausea, anorexia, and a 10-pound weight loss.  She has had an extensive workup by Dr. Russella Dar, including numerous x-rays, numerous endoscopies and colonoscopies and an MRCP, all of which have been normal.  She has not responded to proton pump inhibitors.  She was sent to me by Dr. Claudette Head for consideration of empiric cholecystectomy.  She is brought to the operating room for diagnostic laparoscopy and cholecystectomy.  OPERATIVE FINDINGS:  The gallbladder was not acutely inflamed, but there were extensive adhesions to the gallbladder, suggesting the possibility of prior inflammatory episodes.  The liver appeared healthy.  The anatomy of the  cystic duct and common bile duct and intrahepatic ducts and ampulla of Vater were normal by cholangiography.  There was no filling defect in the cholangiogram, and there was no sign of obstruction.  The stomach and duodenum appeared normal.  The large intestine and small intestine were grossly normal.  There were no intra-abdominal or peritoneal adhesions that I could detect.  DESCRIPTION OF PROCEDURE:  Following the induction of general endotracheal anesthesia, the patients abdomen was prepped and draped in a sterile fashion. Marcaine 0.5% with epinephrine was used as a local infiltration anesthetic.  A transverse incision was made at the lower limb of the umbilicus, through the previous laparoscopy scar.  The fascia was incised in the midline, the abdominal cavity entered under direct vision.  A 10 mm Hasson trocar was inserted and secured with a pursestring suture of 0 Vicryl.  Pneumoperitoneum was created.  A video camera was inserted with visualization and findings as described above.  A 10 mm trocar was placed in the subxiphoid region and two 5 mm trocars placed in the right midabdomen.  The gallbladder was elevated, placed on traction and a fairly extensive lysis of adhesions of omentum from the gallbladder.  We were able to dissect down and identify the infundibulum of the gallbladder, the cystic duct, and the cystic artery.  A cholangiogram was obtained through the cystic duct and showed normal intrahepatic and extrahepatic bile ducts, no evidence of filling defect, and prompt flow of contrast into the duodenum.  Cholangiogram catheter was removed.  The cystic duct was  secured with metal clips and divided.  The cystic artery was secured with metal clips and divided.  The gallbladder was dissected from its bed with electrocautery and removed through the umbilical port.  The operative field and subphrenic space were copiously irrigated with saline.  At the completion of the case,  there was no bleeding and no bile leak whatsoever.  The trocars were removed under direct vision, and there was no bleeding at the trocar sites.  The pneumoperitoneum was released.  The fascia of the umbilicus was closed with 0 Vicryl suture.  The skin incisions were closed with subcuticular sutures of 4-0 Vicryl and Steri-Strips.  Clean bandages were placed and the patient taken to the recovery room in stable condition.  Estimated blood loss was about 10 cc.  Complications:  None.  Sponge, needle, and instrument counts were correct. DD:  01/05/00 TD:  01/05/00 Job: [id number]

## 2010-10-08 ENCOUNTER — Other Ambulatory Visit: Payer: Self-pay | Admitting: Internal Medicine

## 2010-11-02 ENCOUNTER — Telehealth: Payer: Self-pay | Admitting: Internal Medicine

## 2010-11-02 MED ORDER — LISINOPRIL 10 MG PO TABS: 10.0000 mg | ORAL_TABLET | Freq: Every day | ORAL | Status: DC

## 2010-11-02 NOTE — Telephone Encounter (Signed)
rx sent to pharmacy

## 2010-11-02 NOTE — Telephone Encounter (Signed)
Pt needs a refill on her Lisinopril 10mg . [facility] [address] She has a cpx appt already made.

## 2011-01-25 ENCOUNTER — Other Ambulatory Visit: Payer: Self-pay

## 2011-01-30 ENCOUNTER — Encounter: Payer: Self-pay | Admitting: Internal Medicine

## 2011-05-11 ENCOUNTER — Other Ambulatory Visit: Payer: Self-pay | Admitting: Internal Medicine

## 2011-07-30 ENCOUNTER — Other Ambulatory Visit: Payer: Self-pay | Admitting: Internal Medicine

## 2011-10-19 ENCOUNTER — Other Ambulatory Visit: Payer: Self-pay | Admitting: Internal Medicine

## 2011-10-20 ENCOUNTER — Ambulatory Visit: Payer: Self-pay | Admitting: Internal Medicine

## 2011-10-20 ENCOUNTER — Telehealth: Payer: Self-pay | Admitting: Family Medicine

## 2011-10-20 DIAGNOSIS — R030 Elevated blood-pressure reading, without diagnosis of hypertension: Secondary | ICD-10-CM

## 2011-10-20 NOTE — Telephone Encounter (Signed)
Patient is concerned with retaining fluids.   Yesterday she had elevated blood pressure 150/89, felt weak, and she is under a lot of stress.  The fluid is mainly in her hands and some in her legs.  She would like to know if she should start a Rx for fluid?  She denies any SOB or chest pain.

## 2011-10-20 NOTE — Telephone Encounter (Signed)
Amanda [name],  Patient was on WP's schedule but she wanted it triaged. Please see triage not below from CAN - appt still stands. Thanks.

## 2011-10-20 NOTE — Telephone Encounter (Signed)
Pt is sch for cpx  11-15-2011 and cpx labs 11-06-2011

## 2011-10-20 NOTE — Telephone Encounter (Signed)
Caller: Norma/Other; PCP: Dianna Limbo; CB#: [phone]; ; ; Call regarding Elevated BP. Office Wants Her To Talk With A. Nurse Now. They Did Schedule Her an Appointment But Needed Her To Speak With Someone Before; Onset 10/19/11 Patient states she has a "spell" BP 150/89 "I just felt weak and able to speak."  Under alot of stress and working more.  All emergent symptoms ruled out per Hypertension, Diagnosed or Suspected protocol with exception " Diagnosed hypertension, has had a sudden elevation of blood pressure and new swelling around the eyes, smoky, cola-colored or dark urine, or swelling of the extremities."  Appointment has been schedukled  for 11:15 today 10/20/11.  Patient states she will keep that appointment.

## 2011-10-20 NOTE — Telephone Encounter (Signed)
Per Dr [name] - start Lisinopril 10/12.3 mg daily.  Today's appointment has been cancelled.  CPX to be scheduled for 11/15/11 with labs one week earlier.  Patient is aware of new medication and labs ordered.

## 2011-10-23 MED ORDER — LISINOPRIL-HYDROCHLOROTHIAZIDE 10-12.5 MG PO TABS: 1.0000 | ORAL_TABLET | Freq: Every day | ORAL | Status: DC

## 2011-11-06 ENCOUNTER — Other Ambulatory Visit: Payer: Self-pay

## 2011-11-15 ENCOUNTER — Encounter: Payer: Self-pay | Admitting: Internal Medicine

## 2012-01-11 ENCOUNTER — Other Ambulatory Visit: Payer: Self-pay | Admitting: Internal Medicine

## 2012-01-23 ENCOUNTER — Encounter: Payer: Self-pay | Admitting: Internal Medicine

## 2012-01-23 ENCOUNTER — Ambulatory Visit (INDEPENDENT_AMBULATORY_CARE_PROVIDER_SITE_OTHER): Payer: 59 | Admitting: Internal Medicine

## 2012-01-23 VITALS — BP 126/82 | HR 85 | Temp 97.8°F | Wt 143.0 lb

## 2012-01-23 DIAGNOSIS — F17200 Nicotine dependence, unspecified, uncomplicated: Secondary | ICD-10-CM

## 2012-01-23 DIAGNOSIS — Z833 Family history of diabetes mellitus: Secondary | ICD-10-CM

## 2012-01-23 DIAGNOSIS — R030 Elevated blood-pressure reading, without diagnosis of hypertension: Secondary | ICD-10-CM

## 2012-01-23 DIAGNOSIS — Z23 Encounter for immunization: Secondary | ICD-10-CM

## 2012-01-23 DIAGNOSIS — Z8249 Family history of ischemic heart disease and other diseases of the circulatory system: Secondary | ICD-10-CM

## 2012-01-23 DIAGNOSIS — N809 Endometriosis, unspecified: Secondary | ICD-10-CM

## 2012-01-23 DIAGNOSIS — I10 Essential (primary) hypertension: Secondary | ICD-10-CM

## 2012-01-23 NOTE — Patient Instructions (Signed)
Get labs this week   Keep appt   In January.

## 2012-01-23 NOTE — Progress Notes (Signed)
Chief Complaint  Patient presents with  . Follow-up    Med check    HPI: Pt comes in for medication management is overdue for visit last one was 2011 and she is still on her  lsiinopril for this. States it is controlled and has no se f med such as cramps .  No  Cp sob;   bp has been   128  Range     exercises treadmill   Tobacco 3-4 per day tried to stop.  Caffiene   3-4 per day  decaff No etoh.  ROS: See pertinent positives and negatives per HPI.No Record has not been abstracted reviewed centricity summary sheet Family History  Problem Relation Age of Onset  . Diabetes Mother   . Heart disease Mother 58    heart attack in 41s   . Hyperlipidemia Mother   . Hypertension Mother   . Hypertension Father   . Kidney disease Father   . Vascular Disease Father   . Diabetes Brother   . Crohn's disease Brother   . Heart disease Maternal Grandmother 81    MI age 37     G2P2  Mono hepatitis  Colon 2002  Endometriosis  Sciatica left  Hx narcotic dependence under rx see ed visit 2007  Past Surgical History:  Cholecystectomy  Hysterectomy endometriosis right oopherectomy  Family History:   Father: Kidney failure, HBP. vascular disease  Mother: Heart disease, DM, heart attack early 27's, HBP, high cholesterol  Siblings: Brothers- 1- Chron's and DM  MGM ; Mi 46   History   Social History  . Marital Status: Married    Spouse Name: N/A    Number of Children: N/A  . Years of Education: N/A   Social History Main Topics  . Smoking status: Current Every Day Smoker  . Smokeless tobacco: None  . Alcohol Use: Yes  . Drug Use: None  . Sexually Active: None   Other Topics Concern  . None   Social History Narrative   hh of 2 married Tobacco No etoh3-4 caffiene.Self employed G2P2   Outpatient Encounter Prescriptions as of 01/23/2012  Medication Sig Dispense Refill  . lisinopril-hydrochlorothiazide (PRINZIDE,ZESTORETIC) 10-12.5 MG per tablet TAKE 1 TABLET BY MOUTH DAILY.  30  tablet  0  . [DISCONTINUED] lisinopril (PRINIVIL,ZESTRIL) 10 MG tablet TAKE 1 TABLET BY MOUTH EVERY DAY  30 tablet  0     EXAM:  BP 126/82  Pulse 85  Temp 97.8 F (36.6 C) (Oral)  Wt 143 lb (64.864 kg)  SpO2 98%  There is no height on file to calculate BMI.  GENERAL: vitals reviewed and listed above, alert, oriented, appears well hydrated and in no acute distress  HEENT: atraumatic, conjunctiva  clear, no obvious abnormalities on inspection of external nose and ears OP : no lesion edema or exudate   NECK: no obvious masses on inspection palpation   LUNGS: clear to auscultation bilaterally, no wheezes, rales or rhonchi, good air movement  CV: HRRR, no clubbing cyanosis or  peripheral edema nl cap refill  Abdomen:  Sof,t normal bowel sounds without hepatosplenomegaly, no guarding rebound or masses no CVA tenderness  MS: moves all extremities without noticeable focal  abnormality  PSYCH: pleasant and cooperative, no obvious depression or anxiety verbal and in a rush to an appt. Good eye contact.   ASSESSMENT AND PLAN:  Discussed the following assessment and plan:  1. Hypertension    on acei needs monitoring labs  2. Need for prophylactic vaccination and  inoculation against influenza   3. TOBACCO USER    small amount advise dc disc   4. ELEVATED BLOOD PRESSURE    apparently controlled  needs lab monitoring   5. Family history of heart attack    mom and mgm  6. Family history of diabetes mellitus    mom and brother  6. Endometriosis   disc about above encouraged to dc  Tobacco  Continue same meds  Over due for HCM PV and monitoring.  Record revview apparently on  sobaxone  Info not disc with pt today . Will update at her PV.  -Patient advised to return or notify health care team  immediately if symptoms worsen or persist or new concerns arise.  Patient Instructions  Get labs this week   Keep appt   In January.      Neta Mends. Panosh M.D.

## 2012-01-26 ENCOUNTER — Other Ambulatory Visit (INDEPENDENT_AMBULATORY_CARE_PROVIDER_SITE_OTHER): Payer: 59

## 2012-01-26 DIAGNOSIS — R030 Elevated blood-pressure reading, without diagnosis of hypertension: Secondary | ICD-10-CM

## 2012-01-26 LAB — LIPID PANEL
Cholesterol: 211 mg/dL — ABNORMAL HIGH (ref 0–200)
HDL: 56.6 mg/dL (ref >39.00)
Triglycerides: 68 mg/dL (ref 0.0–149.0)

## 2012-01-26 LAB — CBC
HCT: 38.4 % (ref 36.0–46.0)
Hemoglobin: 12.8 g/dL (ref 12.0–15.0)
Platelets: 165 10*3/uL (ref 150.0–400.0)
RBC: 4.24 Mil/uL (ref 3.87–5.11)
RDW: 12.9 % (ref 11.5–14.6)
WBC: 3.7 10*3/uL — ABNORMAL LOW (ref 4.5–10.5)

## 2012-01-26 LAB — HEPATIC FUNCTION PANEL
ALT: 20 U/L (ref 0–35)
Bilirubin, Direct: 0.1 mg/dL (ref 0.0–0.3)
Total Bilirubin: 0.7 mg/dL (ref 0.3–1.2)

## 2012-01-26 LAB — BASIC METABOLIC PANEL
CO2: 27 mEq/L (ref 19–32)
Calcium: 9.1 mg/dL (ref 8.4–10.5)
Chloride: 102 mEq/L (ref 96–112)
Sodium: 136 mEq/L (ref 135–145)

## 2012-01-26 LAB — POCT URINALYSIS DIPSTICK
Bilirubin, UA: NEGATIVE
Blood, UA: NEGATIVE
Glucose, UA: NEGATIVE
Ketones, UA: NEGATIVE
Spec Grav, UA: 1.015
pH, UA: 6

## 2012-01-28 ENCOUNTER — Encounter: Payer: Self-pay | Admitting: Internal Medicine

## 2012-01-28 DIAGNOSIS — Z8249 Family history of ischemic heart disease and other diseases of the circulatory system: Secondary | ICD-10-CM | POA: Insufficient documentation

## 2012-01-28 DIAGNOSIS — Z833 Family history of diabetes mellitus: Secondary | ICD-10-CM | POA: Insufficient documentation

## 2012-01-28 DIAGNOSIS — N809 Endometriosis, unspecified: Secondary | ICD-10-CM | POA: Insufficient documentation

## 2012-01-29 ENCOUNTER — Telehealth: Payer: Self-pay | Admitting: Internal Medicine

## 2012-01-29 NOTE — Telephone Encounter (Signed)
Pt is aware lab work not back

## 2012-01-29 NOTE — Telephone Encounter (Signed)
Pt called req to get lab results.  °

## 2012-01-30 ENCOUNTER — Other Ambulatory Visit: Payer: Self-pay | Admitting: Family Medicine

## 2012-01-30 MED ORDER — LISINOPRIL-HYDROCHLOROTHIAZIDE 10-12.5 MG PO TABS: 1.0000 | ORAL_TABLET | Freq: Every day | ORAL | Status: DC

## 2012-01-30 NOTE — Telephone Encounter (Signed)
Potassium is borderline low rest is ok  Increase potassium rich foods  No change in meds  Will repeat level at her check up.   Please refill her medication for 90 days  thanks

## 2012-01-30 NOTE — Telephone Encounter (Signed)
Please give lab results.

## 2012-01-30 NOTE — Telephone Encounter (Signed)
Pt calling back to check on lab results. I informed her that I do see lab results in EPIC, but that they need to be reviewed by Dr. [name]. Pt states that she has 2 days left of BP meds, but does not want to run out. She is not sure if Dr. [name] will change the med or dosage of the BP med based on lab work. Pt is going on vacation on Monday as well. Please call w/regard to lab results and further instruction on BP med (lisinopril-hydrochlorothiazide (PRINZIDE,ZESTORETIC) 10-12.5 MG per tablet).

## 2012-01-30 NOTE — Telephone Encounter (Signed)
Patient notified by telephone.  Lisinopril-hctz sent to the pharmacy by e-scribe.

## 2012-03-18 ENCOUNTER — Encounter: Payer: Self-pay | Admitting: Internal Medicine

## 2012-03-18 ENCOUNTER — Ambulatory Visit: Payer: Self-pay | Admitting: Internal Medicine

## 2012-03-18 DIAGNOSIS — Z0289 Encounter for other administrative examinations: Secondary | ICD-10-CM

## 2012-03-18 NOTE — Progress Notes (Deleted)
No chief complaint on file.   HPI: Patient comes in today for Preventive Health Care visit   ROS:  GEN/ HEENT: No fever, significant weight changes sweats headaches vision problems hearing changes, CV/ PULM; No chest pain shortness of breath cough, syncope,edema  change in exercise tolerance. GI /GU: No adominal pain, vomiting, change in bowel habits. No blood in the stool. No significant GU symptoms. SKIN/HEME: ,no acute skin rashes suspicious lesions or bleeding. No lymphadenopathy, nodules, masses.  NEURO/ PSYCH:  No neurologic signs such as weakness numbness. No depression anxiety. IMM/ Allergy: No unusual infections.  Allergy .   REST of 12 system review negative except as per HPI   Past Medical History  Diagnosis Date  . Infectious mononucleosis hepatitis   . Endometriosis     hysterectomy nad right oopherectomy  . H/O sciatica     left  . Narcotic dependence, in remission     Family History  Problem Relation Age of Onset  . Diabetes Mother   . Heart disease Mother 32    heart attack in 10s   . Hyperlipidemia Mother   . Hypertension Mother   . Hypertension Father   . Kidney disease Father   . Vascular Disease Father   . Diabetes Brother   . Crohn's disease Brother   . Heart disease Maternal Grandmother 76    MI age 1    History   Social History  . Marital Status: Married    Spouse Name: N/A    Number of Children: N/A  . Years of Education: N/A   Social History Main Topics  . Smoking status: Current Every Day Smoker  . Smokeless tobacco: Not on file  . Alcohol Use: Yes  . Drug Use: Not on file  . Sexually Active: Not on file   Other Topics Concern  . Not on file   Social History Narrative   hh of 2 married Tobacco No etoh3-4 caffiene.Self employed G2P2    Outpatient Encounter Prescriptions as of 03/18/2012  Medication Sig Dispense Refill  . lisinopril-hydrochlorothiazide (PRINZIDE,ZESTORETIC) 10-12.5 MG per tablet Take 1 tablet by mouth daily.  90  tablet  0    EXAM:  There were no vitals taken for this visit.  There is no height or weight on file to calculate BMI.  Physical Exam: Vital signs reviewed WUJ:WJXB is a well-developed well-nourished alert cooperative   female who appears her stated age in no acute distress.  HEENT: normocephalic atraumatic , Eyes: PERRL EOM's full, conjunctiva clear, Nares: paten,t no deformity discharge or tenderness., Ears: no deformity EAC's clear TMs with normal landmarks. Mouth: clear OP, no lesions, edema.  Moist mucous membranes. Dentition in adequate repair. NECK: supple without masses, thyromegaly or bruits. CHEST/PULM:  Clear to auscultation and percussion breath sounds equal no wheeze , rales or rhonchi. No chest wall deformities or tenderness. CV: PMI is nondisplaced, S1 S2 no gallops, murmurs, rubs. Peripheral pulses are full without delay.No JVD .  ABDOMEN: Bowel sounds normal nontender  No guard or rebound, no hepato splenomegal no CVA tenderness.  No hernia. Extremtities:  No clubbing cyanosis or edema, no acute joint swelling or redness no focal atrophy NEURO:  Oriented x3, cranial nerves 3-12 appear to be intact, no obvious focal weakness,gait within normal limits no abnormal reflexes or asymmetrical SKIN: No acute rashes normal turgor, color, no bruising or petechiae. PSYCH: Oriented, good eye contact, no obvious depression anxiety, cognition and judgment appear normal. LN: no cervical axillary inguinal adenopathy  Lab  Results  Component Value Date   WBC 3.7* 01/26/2012   HGB 12.8 01/26/2012   HCT 38.4 01/26/2012   PLT 165.0 01/26/2012   GLUCOSE 110* 01/26/2012   CHOL 211* 01/26/2012   TRIG 68.0 01/26/2012   HDL 56.60 01/26/2012   LDLDIRECT 144.9 01/26/2012   LDLCALC 124* 06/01/2009   ALT 20 01/26/2012   AST 27 01/26/2012   NA 136 01/26/2012   K 3.4* 01/26/2012   CL 102 01/26/2012   CREATININE 0.8 01/26/2012   BUN 10 01/26/2012   CO2 27 01/26/2012   TSH 2.16 01/26/2012     ASSESSMENT AND PLAN:  Discussed the following assessment and plan:  No diagnosis found.  Patient Care Team: Madelin Headings, MD as PCP - General There are no Patient Instructions on file for this visit.  Neta Mends. Panosh M.D.   Health Maintenance  Topic Date Due  . Pap Smear  05/01/1975  . Mammogram  05/01/2007  . Colonoscopy  05/01/2007  . Influenza Vaccine  11/11/2012  . Tetanus/tdap  03/13/2017   Health Maintenance Review {hx healthmaintenance rooming patient:[id number]}

## 2012-03-30 NOTE — Progress Notes (Signed)
  Subjective:    Patient ID: Amanda [name], female    DOB: Mar 01, 1956, 57 y.o.   MRN: [id number]  HPI No show    Review of Systems     Objective:   Physical Exam        Assessment & Plan:

## 2012-06-27 ENCOUNTER — Other Ambulatory Visit: Payer: Self-pay | Admitting: Internal Medicine

## 2012-11-30 ENCOUNTER — Other Ambulatory Visit: Payer: Self-pay | Admitting: Internal Medicine

## 2013-03-03 ENCOUNTER — Other Ambulatory Visit: Payer: Self-pay | Admitting: Internal Medicine

## 2013-04-22 ENCOUNTER — Other Ambulatory Visit: Payer: Self-pay | Admitting: Internal Medicine

## 2013-04-30 ENCOUNTER — Ambulatory Visit (INDEPENDENT_AMBULATORY_CARE_PROVIDER_SITE_OTHER): Payer: 59 | Admitting: Internal Medicine

## 2013-04-30 ENCOUNTER — Ambulatory Visit: Payer: 59 | Admitting: Internal Medicine

## 2013-04-30 ENCOUNTER — Encounter: Payer: Self-pay | Admitting: Internal Medicine

## 2013-04-30 VITALS — BP 132/80 | HR 109 | Temp 98.8°F | Ht 63.5 in | Wt 144.0 lb

## 2013-04-30 DIAGNOSIS — Z833 Family history of diabetes mellitus: Secondary | ICD-10-CM

## 2013-04-30 DIAGNOSIS — M25511 Pain in right shoulder: Secondary | ICD-10-CM | POA: Insufficient documentation

## 2013-04-30 DIAGNOSIS — F17200 Nicotine dependence, unspecified, uncomplicated: Secondary | ICD-10-CM

## 2013-04-30 DIAGNOSIS — Z8249 Family history of ischemic heart disease and other diseases of the circulatory system: Secondary | ICD-10-CM

## 2013-04-30 DIAGNOSIS — M25519 Pain in unspecified shoulder: Secondary | ICD-10-CM

## 2013-04-30 DIAGNOSIS — I10 Essential (primary) hypertension: Secondary | ICD-10-CM

## 2013-04-30 DIAGNOSIS — M25512 Pain in left shoulder: Secondary | ICD-10-CM

## 2013-04-30 LAB — CBC WITH DIFFERENTIAL/PLATELET
Basophils Absolute: 0.1 10*3/uL (ref 0.0–0.1)
Basophils Relative: 1.1 % (ref 0.0–3.0)
EOS PCT: 4.2 % (ref 0.0–5.0)
Eosinophils Absolute: 0.2 10*3/uL (ref 0.0–0.7)
HEMATOCRIT: 38 % (ref 36.0–46.0)
Hemoglobin: 12.3 g/dL (ref 12.0–15.0)
Lymphocytes Relative: 36.7 % (ref 12.0–46.0)
Lymphs Abs: 1.8 10*3/uL (ref 0.7–4.0)
MCHC: 32.5 g/dL (ref 30.0–36.0)
MCV: 92.5 fl (ref 78.0–100.0)
MONOS PCT: 4.4 % (ref 3.0–12.0)
Monocytes Absolute: 0.2 10*3/uL (ref 0.1–1.0)
Neutro Abs: 2.6 10*3/uL (ref 1.4–7.7)
Neutrophils Relative %: 53.6 % (ref 43.0–77.0)
PLATELETS: 154 10*3/uL (ref 150.0–400.0)
RBC: 4.11 Mil/uL (ref 3.87–5.11)
RDW: 12.9 % (ref 11.5–14.6)
WBC: 4.9 10*3/uL (ref 4.5–10.5)

## 2013-04-30 LAB — LIPID PANEL
Cholesterol: 199 mg/dL (ref 0–200)
HDL: 51.5 mg/dL (ref >39.00)
LDL Cholesterol: 131 mg/dL — ABNORMAL HIGH (ref 0–99)
Total CHOL/HDL Ratio: 4
Triglycerides: 81 mg/dL (ref 0.0–149.0)
VLDL: 16.2 mg/dL (ref 0.0–40.0)

## 2013-04-30 LAB — HEPATIC FUNCTION PANEL
ALT: 50 U/L — AB (ref 0–35)
AST: 41 U/L — ABNORMAL HIGH (ref 0–37)
Albumin: 4.1 g/dL (ref 3.5–5.2)
Alkaline Phosphatase: 75 U/L (ref 39–117)
BILIRUBIN DIRECT: 0.1 mg/dL (ref 0.0–0.3)
BILIRUBIN TOTAL: 0.7 mg/dL (ref 0.3–1.2)
Total Protein: 6.8 g/dL (ref 6.0–8.3)

## 2013-04-30 LAB — BASIC METABOLIC PANEL
BUN: 12 mg/dL (ref 6–23)
CALCIUM: 9.2 mg/dL (ref 8.4–10.5)
CO2: 28 mEq/L (ref 19–32)
CREATININE: 0.7 mg/dL (ref 0.4–1.2)
Chloride: 102 mEq/L (ref 96–112)
GFR: 87.65 mL/min (ref >60.00)
Glucose, Bld: 96 mg/dL (ref 70–99)
Potassium: 4 mEq/L (ref 3.5–5.1)
Sodium: 138 mEq/L (ref 135–145)

## 2013-04-30 LAB — HEMOGLOBIN A1C: HEMOGLOBIN A1C: 5.9 % (ref 4.6–6.5)

## 2013-04-30 LAB — TSH: TSH: 2.05 u[IU]/mL (ref 0.35–5.50)

## 2013-04-30 MED ORDER — LISINOPRIL-HYDROCHLOROTHIAZIDE 10-12.5 MG PO TABS: ORAL_TABLET | ORAL | Status: DC

## 2013-04-30 NOTE — Progress Notes (Signed)
Chief Complaint  Patient presents with  . Follow-up  . Hypertension    HPI: FU bp and monitoring   Last visit was 11 13  On meds for HT no se of med per pt but out and using qod recently  Due for lab monitoring  Ad vil sometimes qod. For joint aches  Alee caused palpitations  bp readings   At home around 130/70  ROS: See pertinent positives and negatives per HPI. Aches and pains. Shoulder an issues played tennis  for years   ;low rom  No recent ijury limits her tennis cause of the difficulty ;  Habits   :caffeine a lot   Tobacco  Down to pack per week since new years.   One a month .etoh  Past Medical History  Diagnosis Date  . Infectious mononucleosis hepatitis   . Endometriosis     hysterectomy nad right oopherectomy  . H/O sciatica     left  . Narcotic dependence, in remission     Family History  Problem Relation Age of Onset  . Diabetes Mother   . Heart disease Mother 2950    heart attack in 7150s   . Hyperlipidemia Mother   . Hypertension Mother   . Hypertension Father   . Kidney disease Father   . Vascular Disease Father   . Diabetes Brother   . Crohn's disease Brother   . Heart disease Maternal Grandmother [id number]    MI age 56    History   Social History  . Marital Status: Married    Spouse Name: N/A    Number of Children: N/A  . Years of Education: N/A   Social History Main Topics  . Smoking status: Current Every Day Smoker  . Smokeless tobacco: None  . Alcohol Use: Yes  . Drug Use: None  . Sexual Activity: None   Other Topics Concern  . None   Social History Narrative   hh of 3 married     Tobacco    No etoh   3-4 caffiene.   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 lisinopril-hydrochlorothiazide (PRINZIDE,ZESTORETIC) 10-12.5 MG per tablet TAKE 1 TABLET BY MOUTH DAILY.  . ZUBSOLV 5.7-1.4 MG SUBL   . [DISCONTINUED]  lisinopril-hydrochlorothiazide (PRINZIDE,ZESTORETIC) 10-12.5 MG per tablet TAKE 1 TABLET BY MOUTH DAILY.    EXAM:  BP 132/80  Pulse 109  Temp(Src) 98.8 F (37.1 C) (Oral)  Ht 5' 3.5" (1.613 m)  Wt 144 lb (65.318 kg)  BMI 25.11 kg/m2  SpO2 99%  Body mass index is 25.11 kg/(m^2).  GENERAL: vitals reviewed and listed above, alert, oriented, appears well hydrated and in no acute distress HEENT: atraumatic, conjunctiva  clear, no obvious abnormalities on inspection of external nose and ears  NECK: no obvious masses on inspection palpation  No bruit LUNGS: clear to auscultation bilaterally, no wheezes, rales or rhonchi, good air movement CV: HRRR, no clubbing cyanosis or  peripheral edema nl cap refill  Abdomen:  Sof,t normal bowel sounds without hepatosplenomegaly, no guarding rebound or masses no CVA tenderness MS: moves all extremities without noticeable focal  Abnormality but shoulder   Some lom iverhead full exam not done today  PSYCH: pleasant and cooperative, no obvious depression or anxiety Lab Results  Component Value Date   WBC 3.7* 01/26/2012   HGB 12.8 01/26/2012   HCT  38.4 01/26/2012   PLT 165.0 01/26/2012   GLUCOSE 110* 01/26/2012   CHOL 211* 01/26/2012   TRIG 68.0 01/26/2012   HDL 56.60 01/26/2012   LDLDIRECT 144.9 01/26/2012   LDLCALC 124* 06/01/2009   ALT 20 01/26/2012   AST 27 01/26/2012   NA 136 01/26/2012   K 3.4* 01/26/2012   CL 102 01/26/2012   CREATININE 0.8 01/26/2012   BUN 10 01/26/2012   CO2 27 01/26/2012   TSH 2.16 01/26/2012    ASSESSMENT AND PLAN:  Discussed the following assessment and plan:  Hypertension - qod med for a while. 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  - Plan: Ambulatory referral to Sports Medicine  Tobacco use disorder - working on it will call if wanst rx or other help  -Patient advised to return or notify health care team  if symptoms worsen or persist or new concerns arise.  Patient Instructions  Get mammogram when you can and  colonoscopy screening when due.  ( every 10 years)  Will arrange sports medicine   consult for the  Shoulders  .  Labs today  Will notify you  of labs when available. Stop tobacco . As we discussed .  Monitor blood pressure periodically  To make sure  In range below 140/90 .     Neta Mends. Sherle Mello M.D.  Pre visit review using our clinic review tool, if applicable. No additional management support is needed unless otherwise documented below in the visit note.

## 2013-04-30 NOTE — Patient Instructions (Signed)
Get mammogram when you can and  colonoscopy screening when due.  ( every 10 years)  Will arrange sports medicine   consult for the  Shoulders  .  Labs today  Will notify you  of labs when available. Stop tobacco . As we discussed .  Monitor blood pressure periodically  To make sure  In range below 140/90 .

## 2013-05-02 ENCOUNTER — Encounter: Payer: Self-pay | Admitting: Family Medicine

## 2013-05-02 ENCOUNTER — Other Ambulatory Visit: Payer: Self-pay | Admitting: Family Medicine

## 2013-05-02 DIAGNOSIS — R7989 Other specified abnormal findings of blood chemistry: Secondary | ICD-10-CM

## 2013-05-02 DIAGNOSIS — R945 Abnormal results of liver function studies: Secondary | ICD-10-CM

## 2013-05-21 ENCOUNTER — Ambulatory Visit (INDEPENDENT_AMBULATORY_CARE_PROVIDER_SITE_OTHER): Payer: 59 | Admitting: Family Medicine

## 2013-05-21 ENCOUNTER — Other Ambulatory Visit (INDEPENDENT_AMBULATORY_CARE_PROVIDER_SITE_OTHER): Payer: 59

## 2013-05-21 ENCOUNTER — Encounter: Payer: Self-pay | Admitting: Family Medicine

## 2013-05-21 VITALS — BP 124/70 | HR 100 | Temp 98.1°F | Resp 16 | Wt 145.2 lb

## 2013-05-21 DIAGNOSIS — M25519 Pain in unspecified shoulder: Secondary | ICD-10-CM

## 2013-05-21 DIAGNOSIS — M25511 Pain in right shoulder: Secondary | ICD-10-CM

## 2013-05-21 DIAGNOSIS — M25512 Pain in left shoulder: Secondary | ICD-10-CM

## 2013-05-21 NOTE — Assessment & Plan Note (Signed)
Patient had injection today with complete resolution of pain. Patient was given home exercise program with theraband. Patient will try these exercises as well as icing protocol. Patient and will come back again in 3-4 weeks for further evaluation. Patient continues to have pain or consider x-ray images. Patient has pain on the contralateral shoulder still but will consider injection in the other shoulder. We'll avoid anti-inflammatories because patient states that pain is so minimal.

## 2013-05-21 NOTE — Progress Notes (Signed)
Pre visit review using our clinic review tool, if applicable. No additional management support is needed unless otherwise documented below in the visit note. 

## 2013-05-21 NOTE — Patient Instructions (Signed)
Good to see you.  Try exercises most days of the week.  Ice 20 minutes 2 times a day can help after exercises and before bed.  Heat before exercises if you want.  Vitamin D 2000 IU daily.  Turmeric 500mg  twice daily.  Come back again in 3 weeks.

## 2013-05-21 NOTE — Progress Notes (Signed)
Amanda ScaleZach Smith D.O. [facility] [address] [place], [place] [address] Phone: [phone] Subjective:    I'm seeing this patient by the request  of:  Lorretta HarpPANOSH,Amanda KOTVAN, MD   CC: Bilateral shoulder pain  BJY:[id number]:Subjective Amanda SicksShelia C Grable is a 58 y.o. female coming in with complaint of bilateral shoulder pain.  Patient states that the left is worse than the right. Patient states that this is very intermittent and only occurs one week usually every month. Patient does not notice any association with the weather. Patient does not remember any true injury but is very active playing golf as well as tenderness. Patient states that over the head activities sometimes it's aggravated. Patient states that when it happens he she has a sharp pain this followed by a dull aching sensation for about a week. Patient states that the pain can completely resolve. Does respond to Tylenol and ibuprofen moderately. Patient is a severity of 1-2/10: Evaluation just to make sure he continues to improve.      Past medical history, social, surgical and family history all reviewed in electronic medical record.   Review of Systems: No headache, visual changes, nausea, vomiting, diarrhea, constipation, dizziness, abdominal pain, skin rash, fevers, chills, night sweats, weight loss, swollen lymph nodes, body aches, joint swelling, muscle aches, chest pain, shortness of breath, mood changes.   Objective Blood pressure 124/70, pulse 100, temperature 98.1 F (36.7 C), temperature source Oral, resp. rate 16, weight 145 lb 3.2 oz (65.862 kg), SpO2 99.00%.  General: No apparent distress alert and oriented x3 mood and affect normal, dressed appropriately.  HEENT: Pupils equal, extraocular movements intact  Respiratory: Patient's speak in full sentences and does not appear short of breath  Cardiovascular: No lower extremity edema, non tender, no erythema  Skin: Warm dry intact with no signs of infection or rash on  extremities or on axial skeleton.  Abdomen: Soft nontender  Neuro: Cranial nerves II through XII are intact, neurovascularly intact in all extremities with 2+ DTRs and 2+ pulses.  Lymph: No lymphadenopathy of posterior or anterior cervical chain or axillae bilaterally.  Gait normal with good balance and coordination.  MSK:  Non tender with full range of motion and good stability and symmetric strength and tone of  elbows, wrist, hip, knee and ankles bilaterally.  Shoulder: Left Inspection reveals no abnormalities, atrophy or asymmetry. Palpation is normal with no tenderness over AC joint or bicipital groove. ROM is full in all planes. Rotator cuff strength normal. No signs of impingement with negative Neer and Hawkin's tests, empty can sign. Speeds and Yergason's tests normal.  labral pathology noted with mild Obrien's, negative clunk and good stability. Normal scapular function observed. No painful arc and no drop arm sign. No apprehension sign Right shoulder has some mild impingement signs but full range of motion and negative labral signs. Neurovascular intact with full range of motion and full strength.  MSK US performed of: Left This study was ordered, performed, and interpreted by Terrilee FilesZach Smith D.O.  Shoulder:   Supraspinatus:  Appears normal on long and transverse views, no bursal bulge seen with shoulder abduction on impingement view. Infraspinatus:  Appears normal on long and transverse views. Subscapularis:  Appears normal on long and transverse views. Teres Minor:  Appears normal on long and transverse views. AC joint:  Capsule undistended, no geyser sign. Glenohumeral Joint:  Appears normal with trace effusion Glenoid Labrum:  Patient does have what appears to be a chronic labral tear of the posterior  labrum. There is some calcific changes the Biceps Tendon:  Appears normal on long and transverse views, no fraying of tendon, tendon located in intertubercular groove, no  subluxation with shoulder internal or external rotation. No increased power doppler signal.  Impression: Chronic labral tear  Procedure: Real-time Ultrasound Guided Injection of left glenohumeral joint Device: GE Logiq E  Ultrasound guided injection is preferred based studies that show increased duration, increased effect, greater accuracy, decreased procedural pain, increased response rate with ultrasound guided versus blind injection.  Verbal informed consent obtained.  Time-out conducted.  Noted no overlying erythema, induration, or other signs of local infection.  Skin prepped in a sterile fashion.  Local anesthesia: Topical Ethyl chloride.  With sterile technique and under real time ultrasound guidance:  Joint visualized.  23g 1  inch needle inserted posterior approach. Pictures taken for needle placement. Patient did have injection of 2 cc of 1% lidocaine, 2 cc of 0.5% Marcaine, and 1cc of Kenalog 40 mg/dL. Completed without difficulty  Pain immediately resolved suggesting accurate placement of the medication.  Advised to call if fevers/chills, erythema, induration, drainage, or persistent bleeding.  Images permanently stored and available for review in the ultrasound unit.  Impression: Technically successful ultrasound guided injection.     Impression and Recommendations:     This case required medical decision making of moderate complexity.

## 2013-06-11 ENCOUNTER — Ambulatory Visit: Payer: 59 | Admitting: Family Medicine

## 2013-06-11 DIAGNOSIS — Z0289 Encounter for other administrative examinations: Secondary | ICD-10-CM

## 2014-07-17 ENCOUNTER — Telehealth: Payer: Self-pay | Admitting: Family Medicine

## 2014-07-17 ENCOUNTER — Other Ambulatory Visit: Payer: Self-pay | Admitting: Family Medicine

## 2014-07-17 ENCOUNTER — Other Ambulatory Visit: Payer: Self-pay | Admitting: Internal Medicine

## 2014-07-17 DIAGNOSIS — Z0000 Encounter for general adult medical examination without abnormal findings: Secondary | ICD-10-CM

## 2014-07-17 NOTE — Telephone Encounter (Signed)
lmom for pt to callback to sch an appt °

## 2014-07-17 NOTE — Telephone Encounter (Signed)
Sent to the pharmacy by e-scribe.  Pt is now past due for her CPX and lab work.  Will send a message to scheduling to help her get on the schedule.

## 2014-07-17 NOTE — Telephone Encounter (Signed)
Pt is past due for her yearly physical and lab work.  Please help the pt to make both appointments.  I have placed the lab work.  Thanks!

## 2014-07-17 NOTE — Telephone Encounter (Signed)
Pt has been scheduled.  °

## 2014-10-07 ENCOUNTER — Other Ambulatory Visit: Payer: Self-pay

## 2014-10-08 ENCOUNTER — Other Ambulatory Visit (INDEPENDENT_AMBULATORY_CARE_PROVIDER_SITE_OTHER): Payer: Commercial Managed Care - HMO

## 2014-10-08 DIAGNOSIS — Z0000 Encounter for general adult medical examination without abnormal findings: Secondary | ICD-10-CM

## 2014-10-08 LAB — HEPATIC FUNCTION PANEL
ALT: 20 U/L (ref 0–35)
AST: 24 U/L (ref 0–37)
Albumin: 4 g/dL (ref 3.5–5.2)
Alkaline Phosphatase: 67 U/L (ref 39–117)
Bilirubin, Direct: 0.1 mg/dL (ref 0.0–0.3)
TOTAL PROTEIN: 6.2 g/dL (ref 6.0–8.3)
Total Bilirubin: 0.6 mg/dL (ref 0.2–1.2)

## 2014-10-08 LAB — CBC WITH DIFFERENTIAL/PLATELET
Basophils Absolute: 0 10*3/uL (ref 0.0–0.1)
Basophils Relative: 0.8 % (ref 0.0–3.0)
Eosinophils Absolute: 0.3 10*3/uL (ref 0.0–0.7)
Eosinophils Relative: 5.8 % — ABNORMAL HIGH (ref 0.0–5.0)
HCT: 36.5 % (ref 36.0–46.0)
Hemoglobin: 12.4 g/dL (ref 12.0–15.0)
Lymphocytes Relative: 48.7 % — ABNORMAL HIGH (ref 12.0–46.0)
Lymphs Abs: 2.2 10*3/uL (ref 0.7–4.0)
MCHC: 34 g/dL (ref 30.0–36.0)
MCV: 89.9 fl (ref 78.0–100.0)
Monocytes Absolute: 0.3 10*3/uL (ref 0.1–1.0)
Monocytes Relative: 6.8 % (ref 3.0–12.0)
Neutro Abs: 1.7 10*3/uL (ref 1.4–7.7)
Neutrophils Relative %: 37.9 % — ABNORMAL LOW (ref 43.0–77.0)
Platelets: 180 10*3/uL (ref 150.0–400.0)
RBC: 4.05 Mil/uL (ref 3.87–5.11)
RDW: 13 % (ref 11.5–15.5)
WBC: 4.5 10*3/uL (ref 4.0–10.5)

## 2014-10-08 LAB — BASIC METABOLIC PANEL
BUN: 13 mg/dL (ref 6–23)
CALCIUM: 8.9 mg/dL (ref 8.4–10.5)
CO2: 27 mEq/L (ref 19–32)
Chloride: 104 mEq/L (ref 96–112)
Creatinine, Ser: 0.67 mg/dL (ref 0.40–1.20)
GFR: 96.27 mL/min (ref >60.00)
Glucose, Bld: 105 mg/dL — ABNORMAL HIGH (ref 70–99)
POTASSIUM: 3.5 meq/L (ref 3.5–5.1)
SODIUM: 138 meq/L (ref 135–145)

## 2014-10-08 LAB — LIPID PANEL
CHOLESTEROL: 195 mg/dL (ref 0–200)
HDL: 49.6 mg/dL (ref >39.00)
LDL CALC: 130 mg/dL — AB (ref 0–99)
NonHDL: 145
Total CHOL/HDL Ratio: 4
Triglycerides: 73 mg/dL (ref 0.0–149.0)
VLDL: 14.6 mg/dL (ref 0.0–40.0)

## 2014-10-08 LAB — TSH: TSH: 3.12 u[IU]/mL (ref 0.35–4.50)

## 2014-10-12 ENCOUNTER — Other Ambulatory Visit: Payer: Self-pay | Admitting: Internal Medicine

## 2014-10-13 NOTE — Telephone Encounter (Signed)
Sent to the pharmacy by e-scribe.  Pt has upcoming appt on 10/14/14

## 2014-10-14 ENCOUNTER — Ambulatory Visit (INDEPENDENT_AMBULATORY_CARE_PROVIDER_SITE_OTHER): Payer: Commercial Managed Care - HMO | Admitting: Internal Medicine

## 2014-10-14 ENCOUNTER — Encounter: Payer: Self-pay | Admitting: Internal Medicine

## 2014-10-14 VITALS — BP 136/82 | Temp 98.6°F | Ht 63.0 in | Wt 146.2 lb

## 2014-10-14 DIAGNOSIS — R7301 Impaired fasting glucose: Secondary | ICD-10-CM | POA: Diagnosis not present

## 2014-10-14 DIAGNOSIS — I10 Essential (primary) hypertension: Secondary | ICD-10-CM | POA: Diagnosis not present

## 2014-10-14 DIAGNOSIS — Z0000 Encounter for general adult medical examination without abnormal findings: Secondary | ICD-10-CM

## 2014-10-14 DIAGNOSIS — F17200 Nicotine dependence, unspecified, uncomplicated: Secondary | ICD-10-CM

## 2014-10-14 DIAGNOSIS — Z720 Tobacco use: Secondary | ICD-10-CM | POA: Diagnosis not present

## 2014-10-14 NOTE — Progress Notes (Signed)
Pre visit review using our clinic review tool, if applicable. No additional management support is needed unless otherwise documented below in the visit note.  Chief Complaint  Patient presents with  . Annual Exam    HPI: Patient  Amanda [name]  59 y.o. comes in today for Preventive Health Care visit   HT: doing well.  See dr Evelene Croon  For continuing meds doing well.  No injury . No  changee in health   Health Maintenance  Topic Date Due  . MAMMOGRAM  05/01/2007  . COLONOSCOPY  05/01/2007  . INFLUENZA VACCINE  10/12/2014  . HIV Screening  10/13/2015 (Originally 04/30/1972)  . TETANUS/TDAP  03/13/2017   Health Maintenance Review LIFESTYLE:  Exercise:  Not inactive  Walks 2 x per day.  Tobacco/ETS:  Yes 4 per  Day.  Alcohol:  3 per year  Sugar beverages:  Diet  Sleep: about  5-7   Drug use: no  ROS:  GEN/ HEENT: No fever, significant weight changes sweats headaches vision problems hearing changes, CV/ PULM; No chest pain shortness of breath cough, syncope,edema  change in exercise tolerance. GI /GU: No adominal pain, vomiting, change in bowel habits. No blood in the stool. No significant GU symptoms. SKIN/HEME: ,no acute skin rashes suspicious lesions or bleeding. No lymphadenopathy, nodules, masses.  NEURO/ PSYCH:  No neurologic signs such as weakness numbness. No depression anxiety. IMM/ Allergy: No unusual infections.  Allergy .   REST of 12 system review negative except as per HPI   Past Medical History  Diagnosis Date  . Infectious mononucleosis hepatitis   . Endometriosis     hysterectomy nad right oopherectomy  . H/O sciatica     left  . Narcotic dependence, in remission     Past Surgical History  Procedure Laterality Date  . Total vaginal hysterectomy  2002  . Bunionectomy      Both feet 25 years ago  . Cholecystectomy    . Abdominal hysterectomy      Family History  Problem Relation Age of Onset  . Diabetes Mother   . Heart disease Mother 67   heart attack in 16s   . Hyperlipidemia Mother   . Hypertension Mother   . Hypertension Father   . Kidney disease Father   . Vascular Disease Father   . Diabetes Brother   . Crohn's disease Brother   . Heart disease Maternal Grandmother 58    MI age 27    History   Social History  . Marital Status: Married    Spouse Name: N/A  . Number of Children: N/A  . Years of Education: N/A   Social History Main Topics  . Smoking status: Current Every Day Smoker  . Smokeless tobacco: Not on file  . Alcohol Use: Yes  . Drug Use: Not on file  . Sexual Activity: Not on file   Other Topics Concern  . None   Social History Narrative   hh of 3 married     Tobacco    No etoh   3-4 caffiene.   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 lisinopril-hydrochlorothiazide (PRINZIDE,ZESTORETIC) 10-12.5 MG per tablet TAKE 1 TABLET BY MOUTH DAILY. 90 tablet 0  . ZUBSOLV 5.7-1.4 MG SUBL      No facility-administered medications prior to visit.     EXAM:  BP  136/82 mmHg  Temp(Src) 98.6 F (37 C) (Oral)  Ht 5\' 3"  (1.6 m)  Wt 146 lb 3.2 oz (66.316 kg)  BMI 25.90 kg/m2  Body mass index is 25.9 kg/(m^2).  Physical Exam: Vital signs reviewed ZOX:WRUE is a well-developed well-nourished alert cooperative    who appearsr stated age in no acute distress.  HEENT: normocephalic atraumatic , Eyes: PERRL EOM's full, conjunctiva clear, Nares: paten,t no deformity discharge or tenderness., Ears: no deformity EAC's clear TMs with normal landmarks. Mouth: clear OP, no lesions, edema.  Moist mucous membranes. Dentition in adequate repair. NECK: supple without masses, thyromegaly or bruits. CHEST/PULM:  Clear to auscultation and percussion breath sounds equal no wheeze , rales or rhonchi. No chest wall deformities or tenderness.Breast: normal by inspection . No dimpling, discharge, masses,  tenderness or discharge . CV: PMI is nondisplaced, S1 S2 no gallops, murmurs, rubs. Peripheral pulses are full without delay.No JVD .  ABDOMEN: Bowel sounds normal nontender  No guard or rebound, no hepato splenomegal no CVA tenderness.  No hernia. Extremtities:  No clubbing cyanosis or edema, no acute joint swelling or redness no focal atrophy NEURO:  Oriented x3, cranial nerves 3-12 appear to be intact, no obvious focal weakness,gait within normal limits no abnormal reflexes or asymmetrical SKIN: No acute rashes normal turgor, color, no bruising or petechiae. PSYCH: Oriented, good eye contact, no obvious depression anxiety, cognition and judgment appear normal. LN: no cervical axillary inguinal adenopathy  Lab Results  Component Value Date   WBC 4.5 10/08/2014   HGB 12.4 10/08/2014   HCT 36.5 10/08/2014   PLT 180.0 10/08/2014   GLUCOSE 105* 10/08/2014   CHOL 195 10/08/2014   TRIG 73.0 10/08/2014   HDL 49.60 10/08/2014   LDLDIRECT 144.9 01/26/2012   LDLCALC 130* 10/08/2014   ALT 20 10/08/2014   AST 24 10/08/2014   NA 138 10/08/2014   K 3.5 10/08/2014   CL 104 10/08/2014   CREATININE 0.67 10/08/2014   BUN 13 10/08/2014   CO2 27 10/08/2014   TSH 3.12 10/08/2014   HGBA1C 5.9 04/30/2013   BP Readings from Last 3 Encounters:  10/14/14 136/82  05/21/13 124/70  04/30/13 132/80   Wt Readings from Last 3 Encounters:  10/14/14 146 lb 3.2 oz (66.316 kg)  05/21/13 145 lb 3.2 oz (65.862 kg)  04/30/13 144 lb (65.318 kg)    There are no preventive care reminders to display for this patient.  ASSESSMENT AND PLAN:  Discussed the following assessment and plan:  Visit for preventive health examination  Essential hypertension - controlled  Fasting hyperglycemia - lsi  Tobacco use disorder - dsic cessation motivation new grandchild coming Will get tdap  Before grancdchild delivered  Patient Care Team: Madelin Headings, MD as PCP - General Milagros Evener, MD as Consulting Physician  (Psychiatry) Patient Instructions  Continue lifestyle intervention healthy eating and exercise . Stop tobacco as discussed  Continue  bp medication.  Healthy lifestyle includes : At least 150 minutes of exercise weeks  , weight at healthy levels, which is usually   BMI 19-25. Avoid trans fats and processed foods;  Increase fresh fruits and veges to 5 servings per day. And avoid sweet beverages including tea and juice. Mediterranean diet with olive oil and nuts have been noted to be heart and brain healthy . Avoid tobacco products . Limit  alcohol to  7 per week for women and 14 servings for men.  Get adequate sleep . Wear seat belts . Don't text and  drive .   Wellness visit in a year  With labs       Neta Mends. Panosh M.D.

## 2014-10-14 NOTE — Patient Instructions (Signed)
Continue lifestyle intervention healthy eating and exercise . Stop tobacco as discussed  Continue  bp medication.  Healthy lifestyle includes : At least 150 minutes of exercise weeks  , weight at healthy levels, which is usually   BMI 19-25. Avoid trans fats and processed foods;  Increase fresh fruits and veges to 5 servings per day. And avoid sweet beverages including tea and juice. Mediterranean diet with olive oil and nuts have been noted to be heart and brain healthy . Avoid tobacco products . Limit  alcohol to  7 per week for women and 14 servings for men.  Get adequate sleep . Wear seat belts . Don't text and drive .   Wellness visit in a year  With labs

## 2015-01-01 ENCOUNTER — Ambulatory Visit (INDEPENDENT_AMBULATORY_CARE_PROVIDER_SITE_OTHER): Payer: Commercial Managed Care - HMO | Admitting: Internal Medicine

## 2015-01-01 ENCOUNTER — Encounter: Payer: Self-pay | Admitting: Internal Medicine

## 2015-01-01 VITALS — BP 162/94 | Temp 98.0°F | Wt 150.9 lb

## 2015-01-01 DIAGNOSIS — I10 Essential (primary) hypertension: Secondary | ICD-10-CM

## 2015-01-01 DIAGNOSIS — R8290 Unspecified abnormal findings in urine: Secondary | ICD-10-CM | POA: Diagnosis not present

## 2015-01-01 DIAGNOSIS — R300 Dysuria: Secondary | ICD-10-CM

## 2015-01-01 DIAGNOSIS — Z23 Encounter for immunization: Secondary | ICD-10-CM

## 2015-01-01 LAB — POCT URINALYSIS DIPSTICK
BILIRUBIN UA: NEGATIVE
Blood, UA: NEGATIVE
Glucose, UA: NEGATIVE
KETONES UA: NEGATIVE
Nitrite, UA: POSITIVE
Protein, UA: NEGATIVE
SPEC GRAV UA: 1.01
Urobilinogen, UA: 0.2
pH, UA: 7

## 2015-01-01 MED ORDER — SULFAMETHOXAZOLE-TRIMETHOPRIM 800-160 MG PO TABS: 1.0000 | ORAL_TABLET | Freq: Two times a day (BID) | ORAL | Status: DC

## 2015-01-01 NOTE — Progress Notes (Signed)
Pre visit review using our clinic review tool, if applicable. No additional management support is needed unless otherwise documented below in the visit note.  Chief Complaint  Patient presents with  . Dysuria    HPI: Patient Carolin SicksShelia C Mabus  comes in today for SDA for  new problem evaluation. Pressure    And  Dysuria   For one day   Took azo helps a lot  Remote hx of utis as a child   "My bladder  Not as  It used to be"  ? Weak  Urgency .   Both liver cancer/  Mom in hospital.  chf in and out  ROS: See pertinent positives and negatives per HPI. No fever chills  jsut took bp med hasnt checked in a while but had been ok   Past Medical History  Diagnosis Date  . Infectious mononucleosis hepatitis   . Endometriosis     hysterectomy nad right oopherectomy  . H/O sciatica     left  . Narcotic dependence, in remission [facility](HCC)     Family History  Problem Relation Age of Onset  . Diabetes Mother   . Heart disease Mother 4850    heart attack in 6150s   . Hyperlipidemia Mother   . Hypertension Mother   . Hypertension Father   . Kidney disease Father   . Vascular Disease Father   . Diabetes Brother   . Crohn's disease Brother   . Heart disease Maternal Grandmother 646    MI age 57    Social History   Social History  . Marital Status: Married    Spouse Name: N/A  . Number of Children: N/A  . Years of Education: N/A   Social History Main Topics  . Smoking status: Current Every Day Smoker  . Smokeless tobacco: None  . Alcohol Use: Yes  . Drug Use: None  . Sexual Activity: Not Asked   Other Topics Concern  . None   Social History Narrative   hh of 3 married     Tobacco    No etoh   3-4 caffiene.   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 lisinopril-hydrochlorothiazide (PRINZIDE,ZESTORETIC) 10-12.5 MG per tablet TAKE 1 TABLET BY MOUTH DAILY. 90 tablet  0  . ZUBSOLV 5.7-1.4 MG SUBL      No facility-administered medications prior to visit.     EXAM:  BP 162/94 mmHg  Temp(Src) 98 F (36.7 C) (Oral)  Wt 150 lb 14.4 oz (68.448 kg)  Body mass index is 26.74 kg/(m^2).  GENERAL: vitals reviewed and listed above, alert, oriented, appears well hydrated and in no acute distress HEENT: atraumatic, conjunctiva  clear, no obvious abnormalities on inspection of external nose and ears. Abdomen:  Sof,t normal bowel sounds without hepatosplenomegaly, no guarding rebound or masses no CVA tenderness CV: HRRR, no clubbing cyanosis or  peripheral edema nl cap refill  MS: moves all extremities without noticeable focal  abnormality PSYCH: pleasant and cooperative, no obvious depression or anxiety ua po nitrites trc leuk  ASSESSMENT AND PLAN:  Discussed the following assessment and plan:  Dysuria prob uti - empiric rx ucx pwending - Plan: POC Urinalysis Dipstick, Culture, Urine  Essential hypertension - check reading out of office   Abnormal urine - Plan: Culture, Urine  Need for prophylactic vaccination and inoculation against influenza - Plan:  Flu Vaccine QUAD 36+ mos PF IM (Fluarix & Fluzone Quad PF)  -Patient advised to return or notify health care team  if symptoms worsen ,persist or new concerns arise.  Patient Instructions  Antibiotic .    Will let you know .  Culture  results.  Urinary Tract Infection Urinary tract infections (UTIs) can develop anywhere along your urinary tract. Your urinary tract is your body's drainage system for removing wastes and extra water. Your urinary tract includes two kidneys, two ureters, a bladder, and a urethra. Your kidneys are a pair of bean-shaped organs. Each kidney is about the size of your fist. They are located below your ribs, one on each side of your spine. CAUSES Infections are caused by microbes, which are microscopic organisms, including fungi, viruses, and bacteria. These organisms are so small  that they can only be seen through a microscope. Bacteria are the microbes that most commonly cause UTIs. SYMPTOMS  Symptoms of UTIs may vary by age and gender of the patient and by the location of the infection. Symptoms in young women typically include a frequent and intense urge to urinate and a painful, burning feeling in the bladder or urethra during urination. Older women and men are more likely to be tired, shaky, and weak and have muscle aches and abdominal pain. A fever may mean the infection is in your kidneys. Other symptoms of a kidney infection include pain in your back or sides below the ribs, nausea, and vomiting. DIAGNOSIS To diagnose a UTI, your caregiver will ask you about your symptoms. Your caregiver will also ask you to provide a urine sample. The urine sample will be tested for bacteria and white blood cells. White blood cells are made by your body to help fight infection. TREATMENT  Typically, UTIs can be treated with medication. Because most UTIs are caused by a bacterial infection, they usually can be treated with the use of antibiotics. The choice of antibiotic and length of treatment depend on your symptoms and the type of bacteria causing your infection. HOME CARE INSTRUCTIONS  If you were prescribed antibiotics, take them exactly as your caregiver instructs you. Finish the medication even if you feel better after you have only taken some of the medication.  Drink enough water and fluids to keep your urine clear or pale yellow.  Avoid caffeine, tea, and carbonated beverages. They tend to irritate your bladder.  Empty your bladder often. Avoid holding urine for long periods of time.  Empty your bladder before and after sexual intercourse.  After a bowel movement, women should cleanse from front to back. Use each tissue only once. SEEK MEDICAL CARE IF:   You have back pain.  You develop a fever.  Your symptoms do not begin to resolve within 3 days. SEEK IMMEDIATE  MEDICAL CARE IF:   You have severe back pain or lower abdominal pain.  You develop chills.  You have nausea or vomiting.  You have continued burning or discomfort with urination. MAKE SURE YOU:   Understand these instructions.  Will watch your condition.  Will get help right away if you are not doing well or get worse.   This information is not intended to replace advice given to you by your health care provider. Make sure you discuss any questions you have with your health care provider.   Document Released: 12/07/2004 Document Revised: 11/18/2014 Document Reviewed: 04/07/2011 Elsevier Interactive Patient Education 2016 [employer].      [place]. Mabry Tift M.D.

## 2015-01-01 NOTE — Patient Instructions (Signed)
Antibiotic .    Will let you know .  Culture  results.  Urinary Tract Infection Urinary tract infections (UTIs) can develop anywhere along your urinary tract. Your urinary tract is your body's drainage system for removing wastes and extra water. Your urinary tract includes two kidneys, two ureters, a bladder, and a urethra. Your kidneys are a pair of bean-shaped organs. Each kidney is about the size of your fist. They are located below your ribs, one on each side of your spine. CAUSES Infections are caused by microbes, which are microscopic organisms, including fungi, viruses, and bacteria. These organisms are so small that they can only be seen through a microscope. Bacteria are the microbes that most commonly cause UTIs. SYMPTOMS  Symptoms of UTIs may vary by age and gender of the patient and by the location of the infection. Symptoms in young women typically include a frequent and intense urge to urinate and a painful, burning feeling in the bladder or urethra during urination. Older women and men are more likely to be tired, shaky, and weak and have muscle aches and abdominal pain. A fever may mean the infection is in your kidneys. Other symptoms of a kidney infection include pain in your back or sides below the ribs, nausea, and vomiting. DIAGNOSIS To diagnose a UTI, your caregiver will ask you about your symptoms. Your caregiver will also ask you to provide a urine sample. The urine sample will be tested for bacteria and white blood cells. White blood cells are made by your body to help fight infection. TREATMENT  Typically, UTIs can be treated with medication. Because most UTIs are caused by a bacterial infection, they usually can be treated with the use of antibiotics. The choice of antibiotic and length of treatment depend on your symptoms and the type of bacteria causing your infection. HOME CARE INSTRUCTIONS  If you were prescribed antibiotics, take them exactly as your caregiver instructs  you. Finish the medication even if you feel better after you have only taken some of the medication.  Drink enough water and fluids to keep your urine clear or pale yellow.  Avoid caffeine, tea, and carbonated beverages. They tend to irritate your bladder.  Empty your bladder often. Avoid holding urine for long periods of time.  Empty your bladder before and after sexual intercourse.  After a bowel movement, women should cleanse from front to back. Use each tissue only once. SEEK MEDICAL CARE IF:   You have back pain.  You develop a fever.  Your symptoms do not begin to resolve within 3 days. SEEK IMMEDIATE MEDICAL CARE IF:   You have severe back pain or lower abdominal pain.  You develop chills.  You have nausea or vomiting.  You have continued burning or discomfort with urination. MAKE SURE YOU:   Understand these instructions.  Will watch your condition.  Will get help right away if you are not doing well or get worse.   This information is not intended to replace advice given to you by your health care provider. Make sure you discuss any questions you have with your health care provider.   Document Released: 12/07/2004 Document Revised: 11/18/2014 Document Reviewed: 04/07/2011 Elsevier Interactive Patient Education [employer]2016 Elsevier Inc.

## 2015-01-03 LAB — URINE CULTURE: Colony Count: 7000

## 2015-01-22 ENCOUNTER — Other Ambulatory Visit: Payer: Self-pay | Admitting: Internal Medicine

## 2015-01-25 NOTE — Telephone Encounter (Signed)
Sent to the pharmacy by e-scribe. 

## 2015-03-09 ENCOUNTER — Telehealth: Payer: Self-pay | Admitting: Internal Medicine

## 2015-03-09 NOTE — Telephone Encounter (Signed)
See below

## 2015-03-09 NOTE — Telephone Encounter (Signed)
Home Care Advise Given but would also like advise from PCP.

## 2015-03-09 NOTE — Telephone Encounter (Signed)
[facility] - Client TELEPHONE ADVICE RECORD [facility] Patient Name: Amanda MoneySHELIA Ratti DOB: 1955/05/31 Initial Comment Caller states Joanie CoddingtonSheila Urbanczyk; has appt tomorrow has earache; what can do until can be seen tomorrow; Nurse Assessment Nurse: Elijah Birkaldwell, RN, Stark BrayLynda Date/Time (Eastern Time): 03/09/2015 2:48:03 PM Confirm and document reason for call. If symptomatic, describe symptoms. ---Caller states she has appt. tomorrow has earache. What can do until can be seen tomorrow. Has had congestion for several days. The ears started bothering her yesterday. No fever. Has the patient traveled out of the country within the last 30 days? ---Not Applicable Does the patient have any new or worsening symptoms? ---Yes Will a triage be completed? ---Yes Related visit to physician within the last 2 weeks? ---No Does the PT have any chronic conditions? (i.e. diabetes, asthma, etc.) ---Yes List chronic conditions. ---on BP rx. Is this a behavioral health or substance abuse call? ---No Guidelines Guideline Title Affirmed Question Affirmed Notes Earache Earache (Exceptions: brief ear pain of < 60 minutes duration, earache occurring during air travel Final Disposition User See Physician within 24 Hours [place], RN, Hilton HotelsLynda Comments Caller has an appt. in the am, but does not want to have a rough night like last night with ear pain. Nurse advised that sometimes olive oil drops can help with pain, although contraindicated if she has ever had ear tubes, perforated ear drum, or if she has any drainage from ears. Ibuprofen would be best for pain. Referrals REFERRED TO PCP OFFICE Disagree/Comply: Comply

## 2015-03-09 NOTE — Telephone Encounter (Signed)
Per WP, okay to take ibuprofen ( 400-600 mg) and decongestant if BP below 145/90.  Left a message for a return call.

## 2015-03-10 ENCOUNTER — Ambulatory Visit (INDEPENDENT_AMBULATORY_CARE_PROVIDER_SITE_OTHER): Payer: Commercial Managed Care - HMO | Admitting: Adult Health

## 2015-03-10 ENCOUNTER — Encounter: Payer: Self-pay | Admitting: Adult Health

## 2015-03-10 VITALS — BP 110/70 | HR 90 | Temp 98.1°F | Ht 63.0 in | Wt 150.5 lb

## 2015-03-10 DIAGNOSIS — J0100 Acute maxillary sinusitis, unspecified: Secondary | ICD-10-CM

## 2015-03-10 DIAGNOSIS — H6502 Acute serous otitis media, left ear: Secondary | ICD-10-CM | POA: Diagnosis not present

## 2015-03-10 MED ORDER — DOXYCYCLINE HYCLATE 100 MG PO CAPS: 100.0000 mg | ORAL_CAPSULE | Freq: Two times a day (BID) | ORAL | Status: DC

## 2015-03-10 NOTE — Patient Instructions (Addendum)
It was great meeting you today!  I have sent in a prescription for Doxycyline 100mg , take this twice a day for 10 days.   You can also use a decongestant like Sudafed to help with the fluid behind the ears.   Flonase nasal spray also works well.   Follow up if no improvement in the next 2-3 days.     Sinusitis, Adult Sinusitis is redness, soreness, and inflammation of the paranasal sinuses. Paranasal sinuses are air pockets within the bones of your face. They are located beneath your eyes, in the middle of your forehead, and above your eyes. In healthy paranasal sinuses, mucus is able to drain out, and air is able to circulate through them by way of your nose. However, when your paranasal sinuses are inflamed, mucus and air can become trapped. This can allow bacteria and other germs to grow and cause infection. Sinusitis can develop quickly and last only a short time (acute) or continue over a long period (chronic). Sinusitis that lasts for more than 12 weeks is considered chronic. CAUSES Causes of sinusitis include:  Allergies.  Structural abnormalities, such as displacement of the cartilage that separates your nostrils (deviated septum), which can decrease the air flow through your nose and sinuses and affect sinus drainage.  Functional abnormalities, such as when the small hairs (cilia) that line your sinuses and help remove mucus do not work properly or are not present. SIGNS AND SYMPTOMS Symptoms of acute and chronic sinusitis are the same. The primary symptoms are pain and pressure around the affected sinuses. Other symptoms include:  Upper toothache.  Earache.  Headache.  Bad breath.  Decreased sense of smell and taste.  A cough, which worsens when you are lying flat.  Fatigue.  Fever.  Thick drainage from your nose, which often is green and may contain pus (purulent).  Swelling and warmth over the affected sinuses. DIAGNOSIS Your health care provider will perform  a physical exam. During your exam, your health care provider may perform any of the following to help determine if you have acute sinusitis or chronic sinusitis:  Look in your nose for signs of abnormal growths in your nostrils (nasal polyps).  Tap over the affected sinus to check for signs of infection.  View the inside of your sinuses using an imaging device that has a light attached (endoscope). If your health care provider suspects that you have chronic sinusitis, one or more of the following tests may be recommended:  Allergy tests.  Nasal culture. A sample of mucus is taken from your nose, sent to a lab, and screened for bacteria.  Nasal cytology. A sample of mucus is taken from your nose and examined by your health care provider to determine if your sinusitis is related to an allergy. TREATMENT Most cases of acute sinusitis are related to a viral infection and will resolve on their own within 10 days. Sometimes, medicines are prescribed to help relieve symptoms of both acute and chronic sinusitis. These may include pain medicines, decongestants, nasal steroid sprays, or saline sprays. However, for sinusitis related to a bacterial infection, your health care provider will prescribe antibiotic medicines. These are medicines that will help kill the bacteria causing the infection. Rarely, sinusitis is caused by a fungal infection. In these cases, your health care provider will prescribe antifungal medicine. For some cases of chronic sinusitis, surgery is needed. Generally, these are cases in which sinusitis recurs more than 3 times per year, despite other treatments. HOME CARE INSTRUCTIONS  Drink plenty of water. Water helps thin the mucus so your sinuses can drain more easily.  Use a humidifier.  Inhale steam 3-4 times a day (for example, sit in the bathroom with the shower running).  Apply a warm, moist washcloth to your face 3-4 times a day, or as directed by your health care  provider.  Use saline nasal sprays to help moisten and clean your sinuses.  Take medicines only as directed by your health care provider.  If you were prescribed either an antibiotic or antifungal medicine, finish it all even if you start to feel better. SEEK IMMEDIATE MEDICAL CARE IF:  You have increasing pain or severe headaches.  You have nausea, vomiting, or drowsiness.  You have swelling around your face.  You have vision problems.  You have a stiff neck.  You have difficulty breathing.   This information is not intended to replace advice given to you by your health care provider. Make sure you discuss any questions you have with your health care provider.   Document Released: 02/27/2005 Document Revised: 03/20/2014 Document Reviewed: 03/14/2011 Elsevier Interactive Patient Education [employer].

## 2015-03-10 NOTE — Progress Notes (Signed)
Subjective:    Patient ID: Amanda [name], female    DOB: 1955-09-18, 59 y.o.   MRN: [id number]  HPI  59 year old female, patient of Dr. [name], presents to the office today for an acute issue of bilateral ear pain, sinus pain and pressure in the maxillary sinus cavity, and rhinorrhea. She has had her symptoms for the last 5 days. She takes care of her 69 month old grand baby   Used ibuprofen and warm compresses last night which she endorses helps.   Review of Systems  Constitutional: Positive for fatigue. Negative for fever and chills.  HENT: Positive for congestion, ear pain, postnasal drip, rhinorrhea, sinus pressure and sore throat. Negative for ear discharge, hearing loss and trouble swallowing.   Eyes: Negative.   Respiratory: Negative.   Cardiovascular: Negative.   Skin: Negative.   Neurological: Negative.   Psychiatric/Behavioral: Negative.   All other systems reviewed and are negative.  Past Medical History  Diagnosis Date  . Infectious mononucleosis hepatitis   . Endometriosis     hysterectomy nad right oopherectomy  . H/O sciatica     left  . Narcotic dependence, in remission [facility])     Social History   Social History  . Marital Status: Married    Spouse Name: N/A  . Number of Children: N/A  . Years of Education: N/A   Occupational History  . Not on file.   Social History Main Topics  . Smoking status: Current Every Day Smoker  . Smokeless tobacco: Not on file  . Alcohol Use: Yes  . Drug Use: Not on file  . Sexual Activity: Not on file   Other Topics Concern  . Not on file   Social History Narrative   hh of 3 married     Tobacco    No etoh   3-4 caffiene.   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 Total vaginal hysterectomy  2002  . Bunionectomy      Both feet 25 years ago  . Cholecystectomy    . Abdominal hysterectomy      Family History    Problem Relation Age of Onset  . Diabetes Mother   . Heart disease Mother 70    heart attack in 33s   . Hyperlipidemia Mother   . Hypertension Mother   . Hypertension Father   . Kidney disease Father   . Vascular Disease Father   . Diabetes Brother   . Crohn's disease Brother   . Heart disease Maternal Grandmother 51    MI age 38    Allergies  Allergen Reactions  . Aleve [Naproxen Sodium] Other (See Comments)    Racing heart  Can take ibuprofen  . Atorvastatin     REACTION: myalgias  . Penicillins Rash    Current Outpatient Prescriptions on File Prior to Visit  Medication Sig Dispense Refill  . lisinopril-hydrochlorothiazide (PRINZIDE,ZESTORETIC) 10-12.5 MG tablet TAKE 1 TABLET BY MOUTH DAILY. 90 tablet 1  . ZUBSOLV 5.7-1.4 MG SUBL      No current facility-administered medications on file prior to visit.    BP 110/70 mmHg  Pulse 90  Temp(Src) 98.1 F (36.7 C) (Oral)  Ht  (1.6 m)  Wt 150 lb 8 oz (68.266 kg)  BMI 26.67 kg/m2  SpO2 98%       Objective:   Physical  Exam  Constitutional: She is oriented to person, place, and time. She appears well-developed and well-nourished. No distress.  She appears tired and worn out  HENT:  Head: Normocephalic and atraumatic.  Right Ear: External ear normal.  Left Ear: External ear normal.  Nose: Nose normal.  Mouth/Throat: No oropharyngeal exudate.  No effusion or infection in right ear.  Effusion but no infection in left ear  Neck: Normal range of motion. Neck supple.  Cardiovascular: Normal rate, regular rhythm, normal heart sounds and intact distal pulses.  Exam reveals no gallop and no friction rub.   No murmur heard. Pulmonary/Chest: Effort normal and breath sounds normal. No respiratory distress. She has no wheezes. She has no rales. She exhibits no tenderness.  Lymphadenopathy:    She has no cervical adenopathy.  Neurological: She is alert and oriented to person, place, and time.  Skin: Skin is warm and  dry. No rash noted. She is not diaphoretic. No erythema. No pallor.  Psychiatric: She has a normal mood and affect. Her behavior is normal. Judgment and thought content normal.  Nursing note and vitals reviewed.      Assessment & Plan:  1. Acute maxillary sinusitis, recurrence not specified - doxycycline (VIBRAMYCIN) 100 MG capsule; Take 1 capsule (100 mg total) by mouth 2 (two) times daily.  Dispense: 20 capsule; Refill: 0 - Can use Flonase - Follow up if no improvement in the next 2-3 days.  2. Acute serous otitis media of left ear, recurrence not specified - advised to try Claritin D or Sudafed to help with effusion

## 2015-03-10 NOTE — Telephone Encounter (Signed)
Pt seen by Cory this morning.  

## 2015-03-10 NOTE — Progress Notes (Signed)
Pre visit review using our clinic review tool, if applicable. No additional management support is needed unless otherwise documented below in the visit note. 

## 2015-10-19 ENCOUNTER — Other Ambulatory Visit: Payer: Self-pay | Admitting: Family Medicine

## 2015-10-19 ENCOUNTER — Telehealth: Payer: Self-pay | Admitting: Family Medicine

## 2015-10-19 ENCOUNTER — Other Ambulatory Visit: Payer: Self-pay | Admitting: Internal Medicine

## 2015-10-19 DIAGNOSIS — Z0000 Encounter for general adult medical examination without abnormal findings: Secondary | ICD-10-CM

## 2015-10-19 NOTE — Telephone Encounter (Signed)
Sent to the pharmacy by e-scribe for 90 days.  Pt now due for cpx and lab work.  Message sent to scheduling. 

## 2015-10-19 NOTE — Telephone Encounter (Signed)
Pt is now due for cpx and lab work.  I have placed the lab orders.  Please help her to make both appointments.  Thanks!!

## 2015-10-20 NOTE — Telephone Encounter (Signed)
Pt will call back to sch.

## 2016-01-26 ENCOUNTER — Other Ambulatory Visit: Payer: Self-pay | Admitting: Internal Medicine

## 2016-01-27 NOTE — Telephone Encounter (Signed)
Sent to the pharmacy by e-scribe for 30 days.  #30 only until pt calls for appt.  Has been contacted in the past to make appointment.

## 2016-03-10 ENCOUNTER — Telehealth: Payer: Self-pay | Admitting: Internal Medicine

## 2016-03-10 NOTE — Telephone Encounter (Signed)
I do not prescibe this medication  ( controlled subsances ) . And have not been treating her for this conditions  So  Request denied .
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
## 2016-03-10 NOTE — Telephone Encounter (Signed)
Pt would like md to return her call personal issue. Pt is aware md does not have any appt available as of now. Pt last seen oct 2016

## 2016-03-10 NOTE — Telephone Encounter (Signed)
Spoke to the pt and advised that Dr. Fabian SharpPanosh is unable to fill her prescription at this time.  Pt understands.  Did state she will call back to schedule cpx after she checks her schedule.

## 2016-04-17 ENCOUNTER — Telehealth: Payer: Self-pay | Admitting: Internal Medicine

## 2016-04-17 MED ORDER — LISINOPRIL-HYDROCHLOROTHIAZIDE 10-12.5 MG PO TABS: 1.0000 | ORAL_TABLET | Freq: Every day | ORAL | 0 refills | Status: DC

## 2016-04-17 NOTE — Telephone Encounter (Signed)
Sent to the pharmacy by e-scribe. 

## 2016-04-17 NOTE — Telephone Encounter (Signed)
Ok to refill x 30 days until appt

## 2016-04-17 NOTE — Telephone Encounter (Signed)
Pt request refill  lisinopril-hydrochlorothiazide (PRINZIDE,ZESTORETIC) 10-12.5 MG tablet  Would like a 30 day please Pt has cpe on 05/03/16. but took her last bp pill this am.   [facility] [address]

## 2016-04-24 ENCOUNTER — Other Ambulatory Visit: Payer: Commercial Managed Care - HMO

## 2016-05-02 NOTE — Progress Notes (Deleted)
No chief complaint on file.   HPI: Patient  Amanda [name]  61 y.o. comes in today for Preventive Health Care visit   Health Maintenance  Topic Date Due  . Hepatitis C Screening  Sep 30, 1955  . HIV Screening  04/30/1972  . MAMMOGRAM  05/01/2007  . COLONOSCOPY  05/01/2007  . INFLUENZA VACCINE  10/12/2015  . TETANUS/TDAP  03/13/2017   Health Maintenance Review LIFESTYLE:  Exercise:   Tobacco/ETS: Alcohol:  Sugar beverages: Sleep: Drug use: no HH of  Work:    ROS:  GEN/ HEENT: No fever, significant weight changes sweats headaches vision problems hearing changes, CV/ PULM; No chest pain shortness of breath cough, syncope,edema  change in exercise tolerance. GI /GU: No adominal pain, vomiting, change in bowel habits. No blood in the stool. No significant GU symptoms. SKIN/HEME: ,no acute skin rashes suspicious lesions or bleeding. No lymphadenopathy, nodules, masses.  NEURO/ PSYCH:  No neurologic signs such as weakness numbness. No depression anxiety. IMM/ Allergy: No unusual infections.  Allergy .   REST of 12 system review negative except as per HPI   Past Medical History:  Diagnosis Date  . Endometriosis    hysterectomy nad right oopherectomy  . H/O sciatica    left  . Infectious mononucleosis hepatitis   . Narcotic dependence, in remission [facility](HCC)     Past Surgical History:  Procedure Laterality Date  . ABDOMINAL HYSTERECTOMY    . BUNIONECTOMY     Both feet 25 years ago  . CHOLECYSTECTOMY    . TOTAL VAGINAL HYSTERECTOMY  2002    Family History  Problem Relation Age of Onset  . Diabetes Mother   . Heart disease Mother 9650    heart attack in 7350s   . Hyperlipidemia Mother   . Hypertension Mother   . Hypertension Father   . Kidney disease Father   . Vascular Disease Father   . Diabetes Brother   . Crohn's disease Brother   . Heart disease Maternal Grandmother [id number]    MI age 59    Social History   Social History  . Marital status: Married   Spouse name: N/A  . Number of children: N/A  . Years of education: N/A   Social History Main Topics  . Smoking status: Current Every Day Smoker  . Smokeless tobacco: Not on file  . Alcohol use Yes  . Drug use: Unknown  . Sexual activity: Not on file   Other Topics Concern  . Not on file   Social History Narrative   hh of 3 married     Tobacco    No etoh   3-4 caffiene.   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 doxycycline (VIBRAMYCIN) 100 MG capsule Take 1 capsule (100 mg total) by mouth 2 (two) times daily. 20 capsule 0  . lisinopril-hydrochlorothiazide (PRINZIDE,ZESTORETIC) 10-12.5 MG tablet Take 1 tablet by mouth daily. 30 tablet 0  . ZUBSOLV 5.7-1.4 MG SUBL      No facility-administered medications prior to visit.      EXAM:  There were no vitals taken for this visit.  There is no height or weight on file to calculate BMI. Wt Readings from Last 3 Encounters:  03/10/15 150 lb 8 oz (68.3 kg)  01/01/15 150 lb 14.4 oz (68.4 kg)  10/14/14 146 lb 3.2 oz (66.3 kg)  Physical Exam: Vital signs reviewed ZOX:WRUE is a well-developed well-nourished alert cooperative    who appearsr stated age in no acute distress.  HEENT: normocephalic atraumatic , Eyes: PERRL EOM's full, conjunctiva clear, Nares: paten,t no deformity discharge or tenderness., Ears: no deformity EAC's clear TMs with normal landmarks. Mouth: clear OP, no lesions, edema.  Moist mucous membranes. Dentition in adequate repair. NECK: supple without masses, thyromegaly or bruits. CHEST/PULM:  Clear to auscultation and percussion breath sounds equal no wheeze , rales or rhonchi. No chest wall deformities or tenderness. Breast: normal by inspection . No dimpling, discharge, masses, tenderness or discharge . CV: PMI is nondisplaced, S1 S2 no gallops, murmurs, rubs. Peripheral pulses are full without  delay.No JVD .  ABDOMEN: Bowel sounds normal nontender  No guard or rebound, no hepato splenomegal no CVA tenderness.  No hernia. Extremtities:  No clubbing cyanosis or edema, no acute joint swelling or redness no focal atrophy NEURO:  Oriented x3, cranial nerves 3-12 appear to be intact, no obvious focal weakness,gait within normal limits no abnormal reflexes or asymmetrical SKIN: No acute rashes normal turgor, color, no bruising or petechiae. PSYCH: Oriented, good eye contact, no obvious depression anxiety, cognition and judgment appear normal. LN: no cervical axillary inguinal adenopathy  Lab Results  Component Value Date   WBC 4.5 10/08/2014   HGB 12.4 10/08/2014   HCT 36.5 10/08/2014   PLT 180.0 10/08/2014   GLUCOSE 105 (H) 10/08/2014   CHOL 195 10/08/2014   TRIG 73.0 10/08/2014   HDL 49.60 10/08/2014   LDLDIRECT 144.9 01/26/2012   LDLCALC 130 (H) 10/08/2014   ALT 20 10/08/2014   AST 24 10/08/2014   NA 138 10/08/2014   K 3.5 10/08/2014   CL 104 10/08/2014   CREATININE 0.67 10/08/2014   BUN 13 10/08/2014   CO2 27 10/08/2014   TSH 3.12 10/08/2014   HGBA1C 5.9 04/30/2013    BP Readings from Last 3 Encounters:  03/10/15 110/70  01/01/15 (!) 162/94  10/14/14 136/82    Lab results reviewed with patient   ASSESSMENT AND PLAN:  Discussed the following assessment and plan:  Visit for preventive health examination - Plan: Basic metabolic panel, CBC with Differential/Platelet, Hepatic function panel, Lipid panel, TSH, Hemoglobin A1c  Essential hypertension - Plan: Basic metabolic panel, CBC with Differential/Platelet, Hepatic function panel, Lipid panel, TSH, Hemoglobin A1c  Fasting hyperglycemia - Plan: Basic metabolic panel, CBC with Differential/Platelet, Hepatic function panel, Lipid panel, TSH, Hemoglobin A1c Blood o=monitoring overdue  Patient Care Team: Madelin Headings, MD as PCP - General Milagros Evener, MD as Consulting Physician (Psychiatry) There are no Patient  Instructions on file for this visit.  Neta Mends. Haniyyah Sakuma M.D.

## 2016-05-03 ENCOUNTER — Encounter: Payer: Commercial Managed Care - HMO | Admitting: Internal Medicine

## 2016-05-03 DIAGNOSIS — Z0289 Encounter for other administrative examinations: Secondary | ICD-10-CM

## 2016-05-31 ENCOUNTER — Other Ambulatory Visit: Payer: Self-pay | Admitting: Internal Medicine

## 2016-08-04 ENCOUNTER — Other Ambulatory Visit: Payer: Self-pay | Admitting: Internal Medicine

## 2016-08-04 NOTE — Telephone Encounter (Signed)
Pt has no showed twice and is requesting refill for medication. Please advise.

## 2016-08-04 NOTE — Telephone Encounter (Signed)
Left a VM for pt to give the office a call back  

## 2016-08-04 NOTE — Telephone Encounter (Signed)
Contact patient and find out what we can do to help her keep a follow-up. She can have a regular office visit instead of his CPX but we will need chemistries we can do it at the day of visit.

## 2016-08-10 NOTE — Telephone Encounter (Signed)
Left a VM for pt regarding making an appointment for further refills.

## 2016-08-11 NOTE — Telephone Encounter (Signed)
Patient needs an appointment before refills can be dispensed. Please help patient schedule.

## 2016-08-15 NOTE — Telephone Encounter (Signed)
lmom for pt to call back

## 2016-08-17 NOTE — Telephone Encounter (Signed)
Amanda [name] pt has an appt sch for 08-25-16. Can pt get at least 30 day supply on bp med

## 2016-08-23 NOTE — Progress Notes (Deleted)
No chief complaint on file.   HPI: TANEQUA KRETZ 61 y.o. come in for Chronic disease management   Delayed  bp medication and monitoring  Last pv 8 16 ROS: See pertinent positives and negatives per HPI.  Past Medical History:  Diagnosis Date  . Endometriosis    hysterectomy nad right oopherectomy  . H/O sciatica    left  . Infectious mononucleosis hepatitis   . Narcotic dependence, in remission [facility])     Family History  Problem Relation Age of Onset  . Diabetes Mother   . Heart disease Mother 6       heart attack in 53s   . Hyperlipidemia Mother   . Hypertension Mother   . Hypertension Father   . Kidney disease Father   . Vascular Disease Father   . Diabetes Brother   . Crohn's disease Brother   . Heart disease Maternal Grandmother 64       MI age 41    Social History   Social History  . Marital status: Married    Spouse name: N/A  . Number of children: N/A  . Years of education: N/A   Social History Main Topics  . Smoking status: Current Every Day Smoker  . Smokeless tobacco: Not on file  . Alcohol use Yes  . Drug use: Unknown  . Sexual activity: Not on file   Other Topics Concern  . Not on file   Social History Narrative   hh of 3 married     Tobacco    No etoh   3-4 caffiene.   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 doxycycline (VIBRAMYCIN) 100 MG capsule Take 1 capsule (100 mg total) by mouth 2 (two) times daily. 20 capsule 0  . lisinopril-hydrochlorothiazide (PRINZIDE,ZESTORETIC) 10-12.5 MG tablet TAKE 1 TABLET BY MOUTH EVERY DAY 30 tablet 0  . ZUBSOLV 5.7-1.4 MG SUBL      No facility-administered medications prior to visit.      EXAM:  There were no vitals taken for this visit.  There is no height or weight on file to calculate BMI.  GENERAL: vitals reviewed and listed above, alert, oriented, appears well hydrated  and in no acute distress HEENT: atraumatic, conjunctiva  clear, no obvious abnormalities on inspection of external nose and ears OP : no lesion edema or exudate  NECK: no obvious masses on inspection palpation  LUNGS: clear to auscultation bilaterally, no wheezes, rales or rhonchi, good air movement CV: HRRR, no clubbing cyanosis or  peripheral edema nl cap refill  MS: moves all extremities without noticeable focal  abnormality PSYCH: pleasant and cooperative, no obvious depression or anxiety Lab Results  Component Value Date   WBC 4.5 10/08/2014   HGB 12.4 10/08/2014   HCT 36.5 10/08/2014   PLT 180.0 10/08/2014   GLUCOSE 105 (H) 10/08/2014   CHOL 195 10/08/2014   TRIG 73.0 10/08/2014   HDL 49.60 10/08/2014   LDLDIRECT 144.9 01/26/2012   LDLCALC 130 (H) 10/08/2014   ALT 20 10/08/2014   AST 24 10/08/2014   NA 138 10/08/2014   K 3.5 10/08/2014   CL 104 10/08/2014   CREATININE 0.67 10/08/2014   BUN 13 10/08/2014   CO2 27 10/08/2014   TSH 3.12 10/08/2014   HGBA1C 5.9 04/30/2013   BP Readings from Last 3 Encounters:  03/10/15  110/70  01/01/15 (!) 162/94  10/14/14 136/82   Wt Readings from Last 3 Encounters:  03/10/15 150 lb 8 oz (68.3 kg)  01/01/15 150 lb 14.4 oz (68.4 kg)  10/14/14 146 lb 3.2 oz (66.3 kg)     ASSESSMENT AND PLAN:  Discussed the following assessment and plan:  Essential hypertension  Fasting hyperglycemia  Medication management  Tobacco use disorder Over due for labs  Monitoring see future orders  -Patient advised to return or notify health care team  if  new concerns arise. Reorder labs  cmp lipids There are no Patient Instructions on file for this visit.   Neta MendsWanda K. Lin Glazier M.D.

## 2016-08-25 ENCOUNTER — Ambulatory Visit: Payer: Commercial Managed Care - HMO | Admitting: Internal Medicine

## 2016-08-25 DIAGNOSIS — Z0289 Encounter for other administrative examinations: Secondary | ICD-10-CM

## 2016-09-13 ENCOUNTER — Other Ambulatory Visit: Payer: Self-pay | Admitting: Internal Medicine

## 2016-09-26 NOTE — Progress Notes (Deleted)
No chief complaint on file.   HPI: Amanda [name] 61 y.o. come in for Chronic disease management  Over due for  Med and manamgnet  ROS: See pertinent positives and negatives per HPI.  Past Medical History:  Diagnosis Date  . Endometriosis    hysterectomy nad right oopherectomy  . H/O sciatica    left  . Infectious mononucleosis hepatitis   . Narcotic dependence, in remission [facility])     Family History  Problem Relation Age of Onset  . Diabetes Mother   . Heart disease Mother 62       heart attack in 52s   . Hyperlipidemia Mother   . Hypertension Mother   . Hypertension Father   . Kidney disease Father   . Vascular Disease Father   . Diabetes Brother   . Crohn's disease Brother   . Heart disease Maternal Grandmother 90       MI age 45    Social History   Social History  . Marital status: Married    Spouse name: N/A  . Number of children: N/A  . Years of education: N/A   Social History Main Topics  . Smoking status: Current Every Day Smoker  . Smokeless tobacco: Not on file  . Alcohol use Yes  . Drug use: Unknown  . Sexual activity: Not on file   Other Topics Concern  . Not on file   Social History Narrative   hh of 3 married     Tobacco    No etoh   3-4 caffiene.   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 doxycycline (VIBRAMYCIN) 100 MG capsule Take 1 capsule (100 mg total) by mouth 2 (two) times daily. 20 capsule 0  . lisinopril-hydrochlorothiazide (PRINZIDE,ZESTORETIC) 10-12.5 MG tablet TAKE 1 TABLET BY MOUTH EVERY DAY 30 tablet 0  . ZUBSOLV 5.7-1.4 MG SUBL      No facility-administered medications prior to visit.      EXAM:  There were no vitals taken for this visit.  There is no height or weight on file to calculate BMI.  GENERAL: vitals reviewed and listed above, alert, oriented, appears well hydrated and in no acute  distress HEENT: atraumatic, conjunctiva  clear, no obvious abnormalities on inspection of external nose and ears OP : no lesion edema or exudate  NECK: no obvious masses on inspection palpation  LUNGS: clear to auscultation bilaterally, no wheezes, rales or rhonchi, good air movement CV: HRRR, no clubbing cyanosis or  peripheral edema nl cap refill  MS: moves all extremities without noticeable focal  abnormality PSYCH: pleasant and cooperative, no obvious depression or anxiety Lab Results  Component Value Date   WBC 4.5 10/08/2014   HGB 12.4 10/08/2014   HCT 36.5 10/08/2014   PLT 180.0 10/08/2014   GLUCOSE 105 (H) 10/08/2014   CHOL 195 10/08/2014   TRIG 73.0 10/08/2014   HDL 49.60 10/08/2014   LDLDIRECT 144.9 01/26/2012   LDLCALC 130 (H) 10/08/2014   ALT 20 10/08/2014   AST 24 10/08/2014   NA 138 10/08/2014   K 3.5 10/08/2014   CL 104 10/08/2014   CREATININE 0.67 10/08/2014   BUN 13 10/08/2014   CO2 27 10/08/2014   TSH 3.12 10/08/2014   HGBA1C 5.9 04/30/2013   BP Readings from Last 3 Encounters:  03/10/15 110/70  01/01/15 Marland Kitchen)  162/94  10/14/14 136/82    ASSESSMENT AND PLAN:  Discussed the following assessment and plan:  Medication management  Essential hypertension  Fasting hyperglycemia  Tobacco use disorder lab -Patient advised to return or notify health [name] team  if  new concerns arise.  There are no Patient Instructions on file for this visit.   Neta MendsWanda K. [name] M.D.

## 2016-09-27 ENCOUNTER — Ambulatory Visit: Payer: Commercial Managed Care - HMO | Admitting: Internal Medicine

## 2016-10-18 ENCOUNTER — Ambulatory Visit (INDEPENDENT_AMBULATORY_CARE_PROVIDER_SITE_OTHER): Payer: Self-pay | Admitting: Family Medicine

## 2016-10-18 VITALS — BP 120/80 | HR 90 | Temp 98.6°F | Wt 153.0 lb

## 2016-10-18 DIAGNOSIS — R300 Dysuria: Secondary | ICD-10-CM

## 2016-10-18 LAB — POCT URINALYSIS DIPSTICK
GLUCOSE UA: NEGATIVE
Ketones, UA: NEGATIVE
NITRITE UA: POSITIVE
PH UA: 6 (ref 5.0–8.0)
Protein, UA: NEGATIVE
SPEC GRAV UA: 1.015 (ref 1.010–1.025)
UROBILINOGEN UA: 2 U/dL — AB

## 2016-10-18 NOTE — Progress Notes (Signed)
Subjective:     Patient ID: Amanda [name], female   DOB: [birth year], 61 y.o.   MRN: [id number]  HPI Patient seen for acute visit. Onset last night of some urinary urgency and burning with urination. She states she has not had a UTI in about 15-20 years. No fever. No nausea or vomiting. No flank pain. Drinking fluids well today. Allergic to penicillin. Taken Cipro in the past.  Past Medical History:  Diagnosis Date  . Endometriosis    hysterectomy nad right oopherectomy  . H/O sciatica    left  . Infectious mononucleosis hepatitis   . Narcotic dependence, in remission [facility](HCC)    Past Surgical History:  Procedure Laterality Date  . ABDOMINAL HYSTERECTOMY    . BUNIONECTOMY     Both feet 25 years ago  . CHOLECYSTECTOMY    . TOTAL VAGINAL HYSTERECTOMY  2002    reports that she has been smoking.  She does not have any smokeless tobacco history on file. She reports that she drinks alcohol. Her drug history is not on file. family history includes Crohn's disease in her brother; Diabetes in her brother and mother; Heart disease (age of onset: 8346) in her maternal grandmother; Heart disease (age of onset: 750) in her mother; Hyperlipidemia in her mother; Hypertension in her father and mother; Kidney disease in her father; Vascular Disease in her father. Allergies  Allergen Reactions  . Aleve [Naproxen Sodium] Other (See Comments)    Racing heart  Can take ibuprofen  . Atorvastatin     REACTION: myalgias  . Penicillins Rash     Review of Systems  Constitutional: Negative for appetite change, chills and fever.  Gastrointestinal: Negative for abdominal pain, constipation, diarrhea, nausea and vomiting.  Genitourinary: Positive for dysuria and frequency.  Musculoskeletal: Negative for back pain.  Neurological: Negative for dizziness.       Objective:   Physical Exam  Constitutional: She appears well-developed and well-nourished.  Cardiovascular: Normal rate and regular rhythm.    Pulmonary/Chest: Effort normal and breath sounds normal. No respiratory distress. She has no wheezes. She has no rales.       Assessment:     Dysuria. Suspect uncomplicated cholecystitis    Plan:     -Send urine culture. Her urine dipstick suggests likely UTI -Start Cipro 500 mg twice a day for 5 days -Follow-up promptly for any worsening symptoms or other concerns  Kristian CoveyBruce W Julie-Ann Vanmaanen MD [facility]

## 2016-10-20 LAB — URINE CULTURE

## 2016-10-22 ENCOUNTER — Other Ambulatory Visit: Payer: Self-pay | Admitting: Internal Medicine

## 2016-10-26 ENCOUNTER — Other Ambulatory Visit: Payer: Self-pay | Admitting: Family Medicine

## 2016-10-26 ENCOUNTER — Telehealth: Payer: Self-pay | Admitting: Internal Medicine

## 2016-10-26 MED ORDER — NITROFURANTOIN MONOHYD MACRO 100 MG PO CAPS: 100.0000 mg | ORAL_CAPSULE | Freq: Two times a day (BID) | ORAL | 0 refills | Status: DC

## 2016-10-26 NOTE — Progress Notes (Signed)
Could try change to Macrobid. Rx sent, but if severe symptoms, worsening or not improving needs reevaluation.

## 2016-10-26 NOTE — Telephone Encounter (Signed)
Pt is calling and stated that the medication that she had made her very sick after taking it for 2 days and would like to see if she could get something different b/c she still has the UTI (it is flared up worse).   Pharm:  [facility] on [address]

## 2016-10-26 NOTE — Progress Notes (Signed)
I left a detailed message with the information below at the pts cell number. 

## 2016-11-02 DIAGNOSIS — G894 Chronic pain syndrome: Secondary | ICD-10-CM | POA: Diagnosis not present

## 2016-11-02 DIAGNOSIS — Z79899 Other long term (current) drug therapy: Secondary | ICD-10-CM | POA: Diagnosis not present

## 2016-11-02 DIAGNOSIS — Z79891 Long term (current) use of opiate analgesic: Secondary | ICD-10-CM | POA: Diagnosis not present

## 2016-11-16 ENCOUNTER — Telehealth: Payer: Self-pay | Admitting: Internal Medicine

## 2016-11-16 DIAGNOSIS — Z79891 Long term (current) use of opiate analgesic: Secondary | ICD-10-CM | POA: Diagnosis not present

## 2016-11-16 DIAGNOSIS — Z79899 Other long term (current) drug therapy: Secondary | ICD-10-CM | POA: Diagnosis not present

## 2016-11-16 DIAGNOSIS — G894 Chronic pain syndrome: Secondary | ICD-10-CM | POA: Diagnosis not present

## 2016-11-16 NOTE — Telephone Encounter (Signed)
Pt would like to pick up a copy of her last lab results. Can you print out?  She will be here today about 1 pm to pick up.

## 2016-11-16 NOTE — Telephone Encounter (Signed)
Spoke with pt and advised that her most recent labs are from 2016. She states that she stills a copy of these. Advised pt that she has not been seen for a annual exam in over 2 years. Transferred pt to front staff for scheduling. Labs placed up front for pick up. Nothing further needed at this time.

## 2016-11-16 NOTE — Telephone Encounter (Signed)
Labs were ordered in August but pt has not had drawn.   LMTCH to advise.

## 2016-12-13 ENCOUNTER — Encounter: Payer: Self-pay | Admitting: Internal Medicine

## 2016-12-14 DIAGNOSIS — Z79891 Long term (current) use of opiate analgesic: Secondary | ICD-10-CM | POA: Diagnosis not present

## 2017-01-11 DIAGNOSIS — Z79891 Long term (current) use of opiate analgesic: Secondary | ICD-10-CM | POA: Diagnosis not present

## 2017-02-08 DIAGNOSIS — Z79891 Long term (current) use of opiate analgesic: Secondary | ICD-10-CM | POA: Diagnosis not present

## 2017-02-09 DIAGNOSIS — Z79891 Long term (current) use of opiate analgesic: Secondary | ICD-10-CM | POA: Diagnosis not present

## 2017-02-14 ENCOUNTER — Encounter: Payer: Self-pay | Admitting: Internal Medicine

## 2017-03-15 DIAGNOSIS — Z79891 Long term (current) use of opiate analgesic: Secondary | ICD-10-CM | POA: Diagnosis not present

## 2017-03-19 ENCOUNTER — Ambulatory Visit: Payer: 59 | Admitting: Family Medicine

## 2017-03-19 ENCOUNTER — Encounter: Payer: Self-pay | Admitting: Family Medicine

## 2017-03-19 VITALS — BP 140/80 | HR 80 | Temp 98.1°F | Wt 153.9 lb

## 2017-03-19 DIAGNOSIS — J069 Acute upper respiratory infection, unspecified: Secondary | ICD-10-CM | POA: Diagnosis not present

## 2017-03-19 DIAGNOSIS — B9789 Other viral agents as the cause of diseases classified elsewhere: Secondary | ICD-10-CM | POA: Diagnosis not present

## 2017-03-19 DIAGNOSIS — R0982 Postnasal drip: Secondary | ICD-10-CM

## 2017-03-19 MED ORDER — FLUTICASONE PROPIONATE 50 MCG/ACT NA SUSP: 1.0000 | Freq: Every day | NASAL | 0 refills | Status: DC

## 2017-03-19 NOTE — Patient Instructions (Signed)

## 2017-03-19 NOTE — Progress Notes (Signed)
Subjective:    Patient ID: Amanda [name], female    DOB: Aug 01, 1955, 62 y.o.   MRN: [id number]  Chief Complaint  Patient presents with  . Cough    HPI Patient was seen today for acute concern.  Nasal drainage, slight cough, and occasional sore throat x 4 days.  During the 1st 2 days pt endorses fever Tmax 100.1.  Pt has taken ibuprofen.  Sick contacts include her granddaughter who was sick last wk with a cold.  Past Medical History:  Diagnosis Date  . Endometriosis    hysterectomy nad right oopherectomy  . H/O sciatica    left  . Infectious mononucleosis hepatitis   . Narcotic dependence, in remission (HCC)     Allergies  Allergen Reactions  . Aleve [Naproxen Sodium] Other (See Comments)    Racing heart  Can take ibuprofen  . Atorvastatin     REACTION: myalgias  . Penicillins Rash    ROS General: Denies fever, chills, night sweats, changes in weight, changes in appetite +fever HEENT: Denies headaches, ear pain, changes in vision, rhinorrhea, + sore throat, increased ear pressure CV: Denies CP, palpitations, SOB, orthopnea Pulm: Denies SOB, wheezing  +cough GI: Denies abdominal pain, nausea, vomiting, diarrhea, constipation GU: Denies dysuria, hematuria, frequency, vaginal discharge Msk: Denies muscle cramps, joint pains Neuro: Denies weakness, numbness, tingling Skin: Denies rashes, bruising Psych: Denies depression, anxiety, hallucinations     Objective:    Blood pressure 140/80, pulse 80, temperature 98.1 F (36.7 C), temperature source Oral, weight 153 lb 14.4 oz (69.8 kg).   Gen. Pleasant, well-nourished, in no distress, normal affect  HEENT: /AT, face symmetric, no scleral icterus, PERRLA, nares patent without drainage, pharynx with mild erythema and postnasal drainage, no exudate.  TMs full b/l Neck: No JVD, no thyromegaly, no carotid bruits, no cervical lymphadenopathy. Lungs: no accessory muscle use, CTAB, no wheezes or rales Cardiovascular: RRR, no  m/r/g, no peripheral edema Abdomen: BS present, soft, NT/ND, no hepatosplenomegaly. Neuro:  A&Ox3, CN II-XII intact, normal gait    Wt Readings from Last 3 Encounters:  03/19/17 153 lb 14.4 oz (69.8 kg)  10/18/16 153 lb (69.4 kg)  03/10/15 150 lb 8 oz (68.3 kg)    Lab Results  Component Value Date   WBC 4.5 10/08/2014   HGB 12.4 10/08/2014   HCT 36.5 10/08/2014   PLT 180.0 10/08/2014   GLUCOSE 105 (H) 10/08/2014   CHOL 195 10/08/2014   TRIG 73.0 10/08/2014   HDL 49.60 10/08/2014   LDLDIRECT 144.9 01/26/2012   LDLCALC 130 (H) 10/08/2014   ALT 20 10/08/2014   AST 24 10/08/2014   NA 138 10/08/2014   K 3.5 10/08/2014   CL 104 10/08/2014   CREATININE 0.67 10/08/2014   BUN 13 10/08/2014   CO2 27 10/08/2014   TSH 3.12 10/08/2014   HGBA1C 5.9 04/30/2013    Assessment/Plan:  Postnasal drip -discussed likely contributing to cough and intermittent sore throat. -reviewed proper nasal spray use - Plan: fluticasone (FLONASE) 50 MCG/ACT nasal spray  Viral URI with cough -supportive care -ok to take delsym if needed. -handwashing encouraged. -Tylenol prn for pain/discomfort   F/u prn if symptoms become worse or do not improve.    Abbe AmsterdamShannon Mahlik Lenn, MD

## 2017-03-27 ENCOUNTER — Encounter: Payer: Self-pay | Admitting: Internal Medicine

## 2017-04-12 DIAGNOSIS — Z79891 Long term (current) use of opiate analgesic: Secondary | ICD-10-CM | POA: Diagnosis not present

## 2017-04-17 ENCOUNTER — Encounter: Payer: Self-pay | Admitting: Internal Medicine

## 2017-05-07 ENCOUNTER — Other Ambulatory Visit: Payer: Self-pay | Admitting: Family Medicine

## 2017-05-09 DIAGNOSIS — Z79891 Long term (current) use of opiate analgesic: Secondary | ICD-10-CM | POA: Diagnosis not present

## 2017-06-06 DIAGNOSIS — Z79891 Long term (current) use of opiate analgesic: Secondary | ICD-10-CM | POA: Diagnosis not present

## 2017-07-03 DIAGNOSIS — Z79891 Long term (current) use of opiate analgesic: Secondary | ICD-10-CM | POA: Diagnosis not present

## 2017-07-31 DIAGNOSIS — G894 Chronic pain syndrome: Secondary | ICD-10-CM | POA: Diagnosis not present

## 2017-07-31 DIAGNOSIS — Z79891 Long term (current) use of opiate analgesic: Secondary | ICD-10-CM | POA: Diagnosis not present

## 2017-08-11 ENCOUNTER — Other Ambulatory Visit: Payer: Self-pay | Admitting: Internal Medicine

## 2017-08-28 ENCOUNTER — Other Ambulatory Visit: Payer: Self-pay | Admitting: Internal Medicine

## 2017-08-28 DIAGNOSIS — Z79891 Long term (current) use of opiate analgesic: Secondary | ICD-10-CM | POA: Diagnosis not present

## 2017-08-28 DIAGNOSIS — G894 Chronic pain syndrome: Secondary | ICD-10-CM | POA: Diagnosis not present

## 2017-08-30 ENCOUNTER — Telehealth: Payer: Self-pay | Admitting: Internal Medicine

## 2017-08-30 ENCOUNTER — Other Ambulatory Visit: Payer: Self-pay | Admitting: Internal Medicine

## 2017-08-30 NOTE — Telephone Encounter (Signed)
Copied from CRM [phone]#[id number]. Topic: Quick Communication - Rx Refill/Question >> Aug 30, 2017  1:54 PM Jonette EvaBarksdale, Harvey B wrote: Pt has appt in August  Medication: lisinopril-hydrochlorothiazide (PRINZIDE,ZESTORETIC) 10-12.5 MG tablet [[id number]][[id number]]   Has the patient contacted their pharmacy? Yes.   (Agent: If no, request that the patient contact the pharmacy for the refill.) (Agent: If yes, when and what did the pharmacy advise?)  Preferred Pharmacy (with phone number or street name): [facility]  Agent: Please be advised that RX refills may take up to 3 business days. We ask that you follow-up with your pharmacy.

## 2017-08-31 NOTE — Telephone Encounter (Signed)
Rx refilled by office 08/30/17

## 2017-10-01 DIAGNOSIS — Z79891 Long term (current) use of opiate analgesic: Secondary | ICD-10-CM | POA: Diagnosis not present

## 2017-10-01 DIAGNOSIS — G894 Chronic pain syndrome: Secondary | ICD-10-CM | POA: Diagnosis not present

## 2017-10-16 NOTE — Progress Notes (Signed)
Chief Complaint  Patient presents with  . Annual Exam    Pt having little heart palpitations, noticeable about 2 weeks ago, becoming more frequent. Pt states that she has cut down on caffiene.     HPI: Patient  Amanda [name]  62 y.o. comes in today for Preventive Health Care visit  Last  For pv 2016    Caretaker  Alzheimer lots of obligatinos and stress  Mom has  Atrial fib and chf  Had mi at age 62..by report  Age 53 currently   Bp is controlled . See ros below for concerns also  Health Maintenance  Topic Date Due  . Hepatitis C Screening  07-Apr-1955  . HIV Screening  04/30/1972  . MAMMOGRAM  05/01/2007  . COLONOSCOPY  05/01/2007  . TETANUS/TDAP  03/13/2017  . INFLUENZA VACCINE  10/11/2017   Health Maintenance Review LIFESTYLE:  Exercise:   Stays busy walks.  Tobacco/ETS: 3 cig per day.   24 quit for 9  Then 38  Years  1/3 per day.  Alcohol:   none Sugar beverages: Sleep: its horrible  Since  Menopause.  Off.   Drug use: no HH of 3 1 dog outside  Work:  Heart flutter  Still some worse .  More often no syncope .   ROS: dec libido some night hot fluses still sleep off ever since menopause  GEN/ HEENT: No fever, significant weight changes sweats headaches vision problems hearing changes, CV/ PULM; No chest pain shortness of breath cough, syncope,edema  change in exercise tolerance. GI /GU: No adominal pain, vomiting, change in bowel habits. No blood in the stool. No significant GU symptoms. SKIN/HEME: ,no acute skin rashes suspicious lesions or bleeding. No lymphadenopathy, nodules, masses.  NEURO/ PSYCH:  No neurologic signs such as weakness numbness. No depression anxiety. IMM/ Allergy: No unusual infections.  Allergy .   REST of 12 system review negative except as per HPI   Past Medical History:  Diagnosis Date  . Endometriosis    hysterectomy nad right oopherectomy  . H/O sciatica    left  . Infectious mononucleosis hepatitis   . Narcotic dependence, in  remission [facility])     Past Surgical History:  Procedure Laterality Date  . ABDOMINAL HYSTERECTOMY    . BUNIONECTOMY     Both feet 25 years ago  . CHOLECYSTECTOMY    . TOTAL VAGINAL HYSTERECTOMY  2002    Family History  Problem Relation Age of Onset  . Diabetes Mother   . Heart disease Mother 58       heart attack in 39s   . Hyperlipidemia Mother   . Hypertension Mother   . Hypertension Father   . Kidney disease Father   . Vascular Disease Father   . Diabetes Brother   . Crohn's disease Brother   . Heart disease Maternal Grandmother 29       MI age 60    Social History   Socioeconomic History  . Marital status: Married    Spouse name: Not on file  . Number of children: Not on file  . Years of education: Not on file  . Highest education level: Not on file  Occupational History  . Not on file  Social Needs  . Financial resource strain: Not on file  . Food insecurity:    Worry: Not on file    Inability: Not on file  . Transportation needs:    Medical: Not on file    Non-medical: Not  on file  Tobacco Use  . Smoking status: Current Every Day Smoker  . Smokeless tobacco: Never Used  . Tobacco comment: 3 cigs daily as of 10/17/17  Substance and Sexual Activity  . Alcohol use: Yes  . Drug use: Never  . Sexual activity: Not on file  Lifestyle  . Physical activity:    Days per week: Not on file    Minutes per session: Not on file  . Stress: Not on file  Relationships  . Social connections:    Talks on phone: Not on file    Gets together: Not on file    Attends religious service: Not on file    Active member of club or organization: Not on file    Attends meetings of clubs or organizations: Not on file    Relationship status: Not on file  Other Topics Concern  . Not on file  Social History Narrative   hh of 3 married     Tobacco    No etoh   3-4 caffiene.   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 fluticasone (FLONASE) 50 MCG/ACT nasal spray Place 1 spray into both nostrils daily. 16 g 0  . lisinopril-hydrochlorothiazide (PRINZIDE,ZESTORETIC) 10-12.5 MG tablet TAKE 1 TABLET BY MOUTH DAILY 90 tablet 0  . ZUBSOLV 5.7-1.4 MG SUBL      No facility-administered medications prior to visit.      EXAM:  BP (!) 142/84 (BP Location: Right Arm, Patient Position: Sitting, Cuff Size: Normal)   Pulse 97   Temp 98.2 F (36.8 C) (Oral)   Ht 5' 2.25" (1.581 m)   Wt 151 lb 11.2 oz (68.8 kg)   BMI 27.52 kg/m   Body mass index is 27.52 kg/m. Wt Readings from Last 3 Encounters:  10/17/17 151 lb 11.2 oz (68.8 kg)  03/19/17 153 lb 14.4 oz (69.8 kg)  10/18/16 153 lb (69.4 kg)    Physical Exam: Vital signs reviewed [id number] is a well-developed well-nourished alert cooperative    who appearsr stated age in no acute distress.  HEENT: normocephalic atraumatic , Eyes: PERRL EOM's full, conjunctiva clear, Nares: paten,t no deformity discharge or tenderness., Ears: no deformity EAC's clear TMs with normal landmarks. Mouth: clear OP, no lesions, edema.  Moist mucous membranes. Dentition in adequate repair. NECK: supple without masses, thyromegaly or bruits. CHEST/PULM:  Clear to auscultation and percussion breath sounds equal no wheeze , rales or rhonchi. No chest wall deformities or tenderness. Breast: normal by inspection . No dimpling, discharge, masses, tenderness or discharge . CV: PMI is nondisplaced, S1 S2 no gallops, murmurs, rubs. Peripheral pulses are full without delay.No JVD .  ABDOMEN: Bowel sounds normal nontender  No guard or rebound, no hepato splenomegal no CVA tenderness.   Extremtities:  No clubbing cyanosis or edema, no acute joint swelling or redness no focal atrophy NEURO:  Oriented x3, cranial nerves 3-12 appear to be intact, no obvious focal weakness,gait within normal limits no abnormal reflexes or  asymmetrical SKIN: No acute rashes normal turgor, color, no bruising or petechiae. PSYCH: Oriented, good eye contact, no obvious depression anxiety, cognition and judgment appear normal. LN: no cervical axillary inguinal adenopathy    BP Readings from Last 3 Encounters:  10/17/17 (!) 142/84  03/19/17 140/80  10/18/16 120/80   Wt Readings from Last 3 Encounters:  10/17/17 151  lb 11.2 oz (68.8 kg)  03/19/17 153 lb 14.4 oz (69.8 kg)  10/18/16 153 lb (69.4 kg)    EKG NSR no acute findings  reviewed .   ASSESSMENT AND PLAN:  Discussed the following assessment and plan:  Visit for preventive health examination - Plan: Basic metabolic panel, CBC with Differential/Platelet, Hepatic function panel, Lipid panel, TSH, T4, free, Magnesium  Medication management - Plan: Basic metabolic panel, CBC with Differential/Platelet, Hepatic function panel, Lipid panel, TSH, T4, free, Magnesium  Essential hypertension - Plan: Basic metabolic panel, CBC with Differential/Platelet, Hepatic function panel, Lipid panel, TSH, T4, free, Magnesium  Intermittent palpitations - Plan: Basic metabolic panel, CBC with Differential/Platelet, Hepatic function panel, Lipid panel, TSH, T4, free, Magnesium, EKG 12-Lead  Insomnia, unspecified type - Plan: Basic metabolic panel, CBC with Differential/Platelet, Hepatic function panel, Lipid panel, TSH, T4, free, Magnesium  Tobacco use  Family history of atrial fibrillation  Family history of first degree relative with dementia Behind on hcm   Disc  And info on lung cancer screen mammo  ask about cologuard coverage   Other options discussed  Add exercise etc for libido but wait on labs  Sometimes hormones can help but  Have cv risk  Counseled.  about sleep tobacco palpitations  cv risk factors and   Then plan fu depending on labs and how doing.  Patient Care Team: Leilan Bochenek, Standley Brooking, MD as PCP - General Chucky May, MD as Consulting Physician (Psychiatry) Patient  Instructions  reattantion to sleep hygiene  .  Palpitations are very common and my just be   Premature beats .  Avoid tobacco alcohol triggers.   Get mammogram  Ask about cologuard for colon cancer screening  Or get colonoscopy. If palpitations frequent we can get and echocardiogram of your heart and other if needed      Insomnia Insomnia is a sleep disorder that makes it difficult to fall asleep or to stay asleep. Insomnia can cause tiredness (fatigue), low energy, difficulty concentrating, mood swings, and poor performance at work or school. There are three different ways to classify insomnia:  Difficulty falling asleep.  Difficulty staying asleep.  Waking up too early in the morning.  Any type of insomnia can be long-term (chronic) or short-term (acute). Both are common. Short-term insomnia usually lasts for three months or less. Chronic insomnia occurs at least three times a week for longer than three months. What are the causes? Insomnia may be caused by another condition, situation, or substance, such as:  Anxiety.  Certain medicines.  Gastroesophageal reflux disease (GERD) or other gastrointestinal conditions.  Asthma or other breathing conditions.  Restless legs syndrome, sleep apnea, or other sleep disorders.  Chronic pain.  Menopause. This may include hot flashes.  Stroke.  Abuse of alcohol, tobacco, or illegal drugs.  Depression.  Caffeine.  Neurological disorders, such as Alzheimer disease.  An overactive thyroid (hyperthyroidism).  The cause of insomnia may not be known. What increases the risk? Risk factors for insomnia include:  Gender. Women are more commonly affected than men.  Age. Insomnia is more common as you get older.  Stress. This may involve your professional or personal life.  Income. Insomnia is more common in people with lower income.  Lack of exercise.  Irregular work schedule or night shifts.  Traveling between  different time zones.  What are the signs or symptoms? If you have insomnia, trouble falling asleep or trouble staying asleep is the main symptom. This may lead to other symptoms,  such as:  Feeling fatigued.  Feeling nervous about going to sleep.  Not feeling rested in the morning.  Having trouble concentrating.  Feeling irritable, anxious, or depressed.  How is this treated? Treatment for insomnia depends on the cause. If your insomnia is caused by an underlying condition, treatment will focus on addressing the condition. Treatment may also include:  Medicines to help you sleep.  Counseling or therapy.  Lifestyle adjustments.  Follow these instructions at home:  Take medicines only as directed by your health care provider.  Keep regular sleeping and waking hours. Avoid naps.  Keep a sleep diary to help you and your health care provider figure out what could be causing your insomnia. Include: ? When you sleep. ? When you wake up during the night. ? How well you sleep. ? How rested you feel the next day. ? Any side effects of medicines you are taking. ? What you eat and drink.  Make your bedroom a comfortable place where it is easy to fall asleep: ? Put up shades or special blackout curtains to block light from outside. ? Use a white noise machine to block noise. ? Keep the temperature cool.  Exercise regularly as directed by your health care provider. Avoid exercising right before bedtime.  Use relaxation techniques to manage stress. Ask your health care provider to suggest some techniques that may work well for you. These may include: ? Breathing exercises. ? Routines to release muscle tension. ? Visualizing peaceful scenes.  Cut back on alcohol, caffeinated beverages, and cigarettes, especially close to bedtime. These can disrupt your sleep.  Do not overeat or eat spicy foods right before bedtime. This can lead to digestive discomfort that can make it hard for  you to sleep.  Limit screen use before bedtime. This includes: ? Watching TV. ? Using your smartphone, tablet, and computer.  Stick to a routine. This can help you fall asleep faster. Try to do a quiet activity, brush your teeth, and go to bed at the same time each night.  Get out of bed if you are still awake after 15 minutes of trying to sleep. Keep the lights down, but try reading or doing a quiet activity. When you feel sleepy, go back to bed.  Make sure that you drive carefully. Avoid driving if you feel very sleepy.  Keep all follow-up appointments as directed by your health care provider. This is important. Contact a health care provider if:  You are tired throughout the day or have trouble in your daily routine due to sleepiness.  You continue to have sleep problems or your sleep problems get worse. Get help right away if:  You have serious thoughts about hurting yourself or someone else. This information is not intended to replace advice given to you by your health care provider. Make sure you discuss any questions you have with your health care provider. Document Released: 02/25/2000 Document Revised: 07/30/2015 Document Reviewed: 11/28/2013 Elsevier Interactive Patient Education  2018 [place] Maintenance, Female Adopting a healthy lifestyle and getting preventive care can go a long way to promote health and wellness. Talk with your health care provider about what schedule of regular examinations is right for you. This is a good chance for you to check in with your provider about disease prevention and staying healthy. In between checkups, there are plenty of things you can do on your own. Experts have done a lot of research about which lifestyle changes and preventive measures are  most likely to keep you healthy. Ask your health care provider for more information. Weight and diet Eat a healthy diet  Be sure to include plenty of vegetables, fruits, low-fat  dairy products, and lean protein.  Do not eat a lot of foods high in solid fats, added sugars, or salt.  Get regular exercise. This is one of the most important things you can do for your health. ? Most adults should exercise for at least 150 minutes each week. The exercise should increase your heart rate and make you sweat (moderate-intensity exercise). ? Most adults should also do strengthening exercises at least twice a week. This is in addition to the moderate-intensity exercise.  Maintain a healthy weight  Body mass index (BMI) is a measurement that can be used to identify possible weight problems. It estimates body fat based on height and weight. Your health care provider can help determine your BMI and help you achieve or maintain a healthy weight.  For females 22 years of age and older: ? A BMI below 18.5 is considered underweight. ? A BMI of 18.5 to 24.9 is normal. ? A BMI of 25 to 29.9 is considered overweight. ? A BMI of 30 and above is considered obese.  Watch levels of cholesterol and blood lipids  You should start having your blood tested for lipids and cholesterol at 60 years of age, then have this test every 5 years.  You may need to have your cholesterol levels checked more often if: ? Your lipid or cholesterol levels are high. ? You are older than 60 years of age. ? You are at high risk for heart disease.  Cancer screening Lung Cancer  Lung cancer screening is recommended for adults 75-88 years old who are at high risk for lung cancer because of a history of smoking.  A yearly low-dose CT scan of the lungs is recommended for people who: ? Currently smoke. ? Have quit within the past 15 years. ? Have at least a 30-pack-year history of smoking. A pack year is smoking an average of one pack of cigarettes a day for 1 year.  Yearly screening should continue until it has been 15 years since you quit.  Yearly screening should stop if you develop a health problem that  would prevent you from having lung cancer treatment.  Breast Cancer  Practice breast self-awareness. This means understanding how your breasts normally appear and feel.  It also means doing regular breast self-exams. Let your health care provider know about any changes, no matter how small.  If you are in your 20s or 30s, you should have a clinical breast exam (CBE) by a health care provider every 1-3 years as part of a regular health exam.  If you are 61 or older, have a CBE every year. Also consider having a breast X-ray (mammogram) every year.  If you have a family history of breast cancer, talk to your health care provider about genetic screening.  If you are at high risk for breast cancer, talk to your health care provider about having an MRI and a mammogram every year.  Breast cancer gene (BRCA) assessment is recommended for women who have family members with BRCA-related cancers. BRCA-related cancers include: ? Breast. ? Ovarian. ? Tubal. ? Peritoneal cancers.  Results of the assessment will determine the need for genetic counseling and BRCA1 and BRCA2 testing.  Cervical Cancer Your health care provider may recommend that you be screened regularly for cancer of the pelvic organs (  ovaries, uterus, and vagina). This screening involves a pelvic examination, including checking for microscopic changes to the surface of your cervix (Pap test). You may be encouraged to have this screening done every 3 years, beginning at age 25.  For women ages 61-65, health care providers may recommend pelvic exams and Pap testing every 3 years, or they may recommend the Pap and pelvic exam, combined with testing for human papilloma virus (HPV), every 5 years. Some types of HPV increase your risk of cervical cancer. Testing for HPV may also be done on women of any age with unclear Pap test results.  Other health care providers may not recommend any screening for nonpregnant women who are considered low  risk for pelvic cancer and who do not have symptoms. Ask your health care provider if a screening pelvic exam is right for you.  If you have had past treatment for cervical cancer or a condition that could lead to cancer, you need Pap tests and screening for cancer for at least 20 years after your treatment. If Pap tests have been discontinued, your risk factors (such as having a new sexual partner) need to be reassessed to determine if screening should resume. Some women have medical problems that increase the chance of getting cervical cancer. In these cases, your health care provider may recommend more frequent screening and Pap tests.  Colorectal Cancer  This type of cancer can be detected and often prevented.  Routine colorectal cancer screening usually begins at 60 years of age and continues through 60 years of age.  Your health care provider may recommend screening at an earlier age if you have risk factors for colon cancer.  Your health care provider may also recommend using home test kits to check for hidden blood in the stool.  A small camera at the end of a tube can be used to examine your colon directly (sigmoidoscopy or colonoscopy). This is done to check for the earliest forms of colorectal cancer.  Routine screening usually begins at age 36.  Direct examination of the colon should be repeated every 5-10 years through 60 years of age. However, you may need to be screened more often if early forms of precancerous polyps or small growths are found.  Skin Cancer  Check your skin from head to toe regularly.  Tell your health care provider about any new moles or changes in moles, especially if there is a change in a mole's shape or color.  Also tell your health care provider if you have a mole that is larger than the size of a pencil eraser.  Always use sunscreen. Apply sunscreen liberally and repeatedly throughout the day.  Protect yourself by wearing long sleeves, pants, a  wide-brimmed hat, and sunglasses whenever you are outside.  Heart disease, diabetes, and high blood pressure  High blood pressure causes heart disease and increases the risk of stroke. High blood pressure is more likely to develop in: ? People who have blood pressure in the high end of the normal range (130-139/85-89 mm Hg). ? People who are overweight or obese. ? People who are African American.  If you are 49-70 years of age, have your blood pressure checked every 3-5 years. If you are 59 years of age or older, have your blood pressure checked every year. You should have your blood pressure measured twice-once when you are at a hospital or clinic, and once when you are not at a hospital or clinic. Record the average of the two  measurements. To check your blood pressure when you are not at a hospital or clinic, you can use: ? An automated blood pressure machine at a pharmacy. ? A home blood pressure monitor.  If you are between 25 years and 87 years old, ask your health care provider if you should take aspirin to prevent strokes.  Have regular diabetes screenings. This involves taking a blood sample to check your fasting blood sugar level. ? If you are at a normal weight and have a low risk for diabetes, have this test once every three years after 60 years of age. ? If you are overweight and have a high risk for diabetes, consider being tested at a younger age or more often. Preventing infection Hepatitis B  If you have a higher risk for hepatitis B, you should be screened for this virus. You are considered at high risk for hepatitis B if: ? You were born in a country where hepatitis B is common. Ask your health care provider which countries are considered high risk. ? Your parents were born in a high-risk country, and you have not been immunized against hepatitis B (hepatitis B vaccine). ? You have HIV or AIDS. ? You use needles to inject street drugs. ? You live with someone who has  hepatitis B. ? You have had sex with someone who has hepatitis B. ? You get hemodialysis treatment. ? You take certain medicines for conditions, including cancer, organ transplantation, and autoimmune conditions.  Hepatitis C  Blood testing is recommended for: ? Everyone born from [birth year] through 1963. ? Anyone with known risk factors for hepatitis C.  Sexually transmitted infections (STIs)  You should be screened for sexually transmitted infections (STIs) including gonorrhea and chlamydia if: ? You are sexually active and are younger than 60 years of age. ? You are older than 60 years of age and your health care provider tells you that you are at risk for this type of infection. ? Your sexual activity has changed since you were last screened and you are at an increased risk for chlamydia or gonorrhea. Ask your health care provider if you are at risk.  If you do not have HIV, but are at risk, it may be recommended that you take a prescription medicine daily to prevent HIV infection. This is called pre-exposure prophylaxis (PrEP). You are considered at risk if: ? You are sexually active and do not regularly use condoms or know the HIV status of your partner(s). ? You take drugs by injection. ? You are sexually active with a partner who has HIV.  Talk with your health care provider about whether you are at high risk of being infected with HIV. If you choose to begin PrEP, you should first be tested for HIV. You should then be tested every 3 months for as long as you are taking PrEP. Pregnancy  If you are premenopausal and you may become pregnant, ask your health care provider about preconception counseling.  If you may become pregnant, take 400 to 800 micrograms (mcg) of folic acid every day.  If you want to prevent pregnancy, talk to your health care provider about birth control (contraception). Osteoporosis and menopause  Osteoporosis is a disease in which the bones lose minerals and  strength with aging. This can result in serious bone fractures. Your risk for osteoporosis can be identified using a bone density scan.  If you are 1 years of age or older, or if you are at risk for osteoporosis  and fractures, ask your health care provider if you should be screened.  Ask your health care provider whether you should take a calcium or vitamin D supplement to lower your risk for osteoporosis.  Menopause may have certain physical symptoms and risks.  Hormone replacement therapy may reduce some of these symptoms and risks. Talk to your health care provider about whether hormone replacement therapy is right for you. Follow these instructions at home:  Schedule regular health, dental, and eye exams.  Stay current with your immunizations.  Do not use any tobacco products including cigarettes, chewing tobacco, or electronic cigarettes.  If you are pregnant, do not drink alcohol.  If you are breastfeeding, limit how much and how often you drink alcohol.  Limit alcohol intake to no more than 1 drink per day for nonpregnant women. One drink equals 12 ounces of beer, 5 ounces of wine, or 1 ounces of hard liquor.  Do not use street drugs.  Do not share needles.  Ask your health care provider for help if you need support or information about quitting drugs.  Tell your health care provider if you often feel depressed.  Tell your health care provider if you have ever been abused or do not feel safe at home. This information is not intended to replace advice given to you by your health care provider. Make sure you discuss any questions you have with your health care provider. Document Released: 09/12/2010 Document Revised: 08/05/2015 Document Reviewed: 12/01/2014 Elsevier Interactive Patient Education  2018 [employer].    Palpitations A palpitation is the feeling that your heartbeat is irregular or is faster than normal. It may feel like your heart is fluttering or  skipping a beat. Palpitations are usually not a serious problem. They may be caused by many things, including smoking, caffeine, alcohol, stress, and certain medicines. Although most causes of palpitations are not serious, palpitations can be a sign of a serious medical problem. In some cases, you may need further medical evaluation. Follow these instructions at home: Pay attention to any changes in your symptoms. Take these actions to help with your condition:  Avoid the following: ? Caffeinated coffee, tea, soft drinks, diet pills, and energy drinks. ? Chocolate. ? Alcohol.  Do not use any tobacco products, such as cigarettes, chewing tobacco, and e-cigarettes. If you need help quitting, ask your health care provider.  Try to reduce your stress and anxiety. Things that can help you relax include: ? Yoga. ? Meditation. ? Physical activity, such as swimming, jogging, or walking. ? Biofeedback. This is a method that helps you learn to use your mind to control things in your body, such as your heartbeats.  Get plenty of rest and sleep.  Take over-the-counter and prescription medicines only as told by your health care provider.  Keep all follow-up visits as told by your health care provider. This is important.  Contact a health care provider if:  You continue to have a fast or irregular heartbeat after 24 hours.  Your palpitations occur more often. Get help right away if:  You have chest pain or shortness of breath.  You have a severe headache.  You feel dizzy or you faint. This information is not intended to replace advice given to you by your health care provider. Make sure you discuss any questions you have with your health care provider. Document Released: 02/25/2000 Document Revised: 08/02/2015 Document Reviewed: 11/12/2014 Elsevier Interactive Patient Education  [employer].  Standley Brooking. Aseneth Hack M.D.

## 2017-10-17 ENCOUNTER — Encounter: Payer: Self-pay | Admitting: Internal Medicine

## 2017-10-17 ENCOUNTER — Ambulatory Visit (INDEPENDENT_AMBULATORY_CARE_PROVIDER_SITE_OTHER): Payer: 59 | Admitting: Internal Medicine

## 2017-10-17 VITALS — BP 142/84 | HR 97 | Temp 98.2°F | Ht 62.25 in | Wt 151.7 lb

## 2017-10-17 DIAGNOSIS — R002 Palpitations: Secondary | ICD-10-CM | POA: Diagnosis not present

## 2017-10-17 DIAGNOSIS — Z720 Tobacco use: Secondary | ICD-10-CM

## 2017-10-17 DIAGNOSIS — Z79899 Other long term (current) drug therapy: Secondary | ICD-10-CM

## 2017-10-17 DIAGNOSIS — G4700 Insomnia, unspecified: Secondary | ICD-10-CM | POA: Diagnosis not present

## 2017-10-17 DIAGNOSIS — I10 Essential (primary) hypertension: Secondary | ICD-10-CM | POA: Diagnosis not present

## 2017-10-17 DIAGNOSIS — Z8249 Family history of ischemic heart disease and other diseases of the circulatory system: Secondary | ICD-10-CM

## 2017-10-17 DIAGNOSIS — Z0000 Encounter for general adult medical examination without abnormal findings: Secondary | ICD-10-CM

## 2017-10-17 DIAGNOSIS — Z818 Family history of other mental and behavioral disorders: Secondary | ICD-10-CM

## 2017-10-17 LAB — HEPATIC FUNCTION PANEL
ALK PHOS: 109 U/L (ref 39–117)
ALT: 50 U/L — AB (ref 0–35)
AST: 47 U/L — ABNORMAL HIGH (ref 0–37)
Albumin: 4.6 g/dL (ref 3.5–5.2)
BILIRUBIN DIRECT: 0.2 mg/dL (ref 0.0–0.3)
BILIRUBIN TOTAL: 1 mg/dL (ref 0.2–1.2)
TOTAL PROTEIN: 6.8 g/dL (ref 6.0–8.3)

## 2017-10-17 LAB — CBC WITH DIFFERENTIAL/PLATELET
BASOS ABS: 0.1 10*3/uL (ref 0.0–0.1)
Basophils Relative: 1.5 % (ref 0.0–3.0)
Eosinophils Absolute: 0.2 10*3/uL (ref 0.0–0.7)
Eosinophils Relative: 3.2 % (ref 0.0–5.0)
HEMATOCRIT: 38.5 % (ref 36.0–46.0)
HEMOGLOBIN: 13.1 g/dL (ref 12.0–15.0)
LYMPHS PCT: 30 % (ref 12.0–46.0)
Lymphs Abs: 1.5 10*3/uL (ref 0.7–4.0)
MCHC: 34 g/dL (ref 30.0–36.0)
MCV: 90.2 fl (ref 78.0–100.0)
Monocytes Absolute: 0.3 10*3/uL (ref 0.1–1.0)
Monocytes Relative: 5.8 % (ref 3.0–12.0)
Neutro Abs: 2.9 10*3/uL (ref 1.4–7.7)
Neutrophils Relative %: 59.5 % (ref 43.0–77.0)
Platelets: 171 10*3/uL (ref 150.0–400.0)
RBC: 4.27 Mil/uL (ref 3.87–5.11)
RDW: 12.5 % (ref 11.5–15.5)
WBC: 4.9 10*3/uL (ref 4.0–10.5)

## 2017-10-17 LAB — LIPID PANEL
CHOL/HDL RATIO: 4
Cholesterol: 201 mg/dL — ABNORMAL HIGH (ref 0–200)
HDL: 55.3 mg/dL (ref >39.00)
LDL CALC: 132 mg/dL — AB (ref 0–99)
NONHDL: 145.96
Triglycerides: 72 mg/dL (ref 0.0–149.0)
VLDL: 14.4 mg/dL (ref 0.0–40.0)

## 2017-10-17 LAB — BASIC METABOLIC PANEL
BUN: 8 mg/dL (ref 6–23)
CALCIUM: 9.7 mg/dL (ref 8.4–10.5)
CO2: 33 mEq/L — ABNORMAL HIGH (ref 19–32)
Chloride: 101 mEq/L (ref 96–112)
Creatinine, Ser: 0.7 mg/dL (ref 0.40–1.20)
GFR: 90.58 mL/min (ref >60.00)
Glucose, Bld: 117 mg/dL — ABNORMAL HIGH (ref 70–99)
Potassium: 4.2 mEq/L (ref 3.5–5.1)
Sodium: 139 mEq/L (ref 135–145)

## 2017-10-17 LAB — MAGNESIUM: Magnesium: 2.1 mg/dL (ref 1.5–2.5)

## 2017-10-17 LAB — TSH: TSH: 2.01 u[IU]/mL (ref 0.35–4.50)

## 2017-10-17 LAB — T4, FREE: FREE T4: 0.95 ng/dL (ref 0.60–1.60)

## 2017-10-17 NOTE — Patient Instructions (Addendum)
reattantion to sleep hygiene  .  Palpitations are very common and my just be   Premature beats .  Avoid tobacco alcohol triggers.   Get mammogram  Ask about cologuard for colon cancer screening  Or get colonoscopy. If palpitations frequent we can get and echocardiogram of your heart and other if needed      Insomnia Insomnia is a sleep disorder that makes it difficult to fall asleep or to stay asleep. Insomnia can cause tiredness (fatigue), low energy, difficulty concentrating, mood swings, and poor performance at work or school. There are three different ways to classify insomnia:  Difficulty falling asleep.  Difficulty staying asleep.  Waking up too early in the morning.  Any type of insomnia can be long-term (chronic) or short-term (acute). Both are common. Short-term insomnia usually lasts for three months or less. Chronic insomnia occurs at least three times a week for longer than three months. What are the causes? Insomnia may be caused by another condition, situation, or substance, such as:  Anxiety.  Certain medicines.  Gastroesophageal reflux disease (GERD) or other gastrointestinal conditions.  Asthma or other breathing conditions.  Restless legs syndrome, sleep apnea, or other sleep disorders.  Chronic pain.  Menopause. This may include hot flashes.  Stroke.  Abuse of alcohol, tobacco, or illegal drugs.  Depression.  Caffeine.  Neurological disorders, such as Alzheimer disease.  An overactive thyroid (hyperthyroidism).  The cause of insomnia may not be known. What increases the risk? Risk factors for insomnia include:  Gender. Women are more commonly affected than men.  Age. Insomnia is more common as you get older.  Stress. This may involve your professional or personal life.  Income. Insomnia is more common in people with lower income.  Lack of exercise.  Irregular work schedule or night shifts.  Traveling between different time  zones.  What are the signs or symptoms? If you have insomnia, trouble falling asleep or trouble staying asleep is the main symptom. This may lead to other symptoms, such as:  Feeling fatigued.  Feeling nervous about going to sleep.  Not feeling rested in the morning.  Having trouble concentrating.  Feeling irritable, anxious, or depressed.  How is this treated? Treatment for insomnia depends on the cause. If your insomnia is caused by an underlying condition, treatment will focus on addressing the condition. Treatment may also include:  Medicines to help you sleep.  Counseling or therapy.  Lifestyle adjustments.  Follow these instructions at home:  Take medicines only as directed by your health care provider.  Keep regular sleeping and waking hours. Avoid naps.  Keep a sleep diary to help you and your health care provider figure out what could be causing your insomnia. Include: ? When you sleep. ? When you wake up during the night. ? How well you sleep. ? How rested you feel the next day. ? Any side effects of medicines you are taking. ? What you eat and drink.  Make your bedroom a comfortable place where it is easy to fall asleep: ? Put up shades or special blackout curtains to block light from outside. ? Use a white noise machine to block noise. ? Keep the temperature cool.  Exercise regularly as directed by your health care provider. Avoid exercising right before bedtime.  Use relaxation techniques to manage stress. Ask your health care provider to suggest some techniques that may work well for you. These may include: ? Breathing exercises. ? Routines to release muscle tension. ? Visualizing peaceful scenes.  Cut back on alcohol, caffeinated beverages, and cigarettes, especially close to bedtime. These can disrupt your sleep.  Do not overeat or eat spicy foods right before bedtime. This can lead to digestive discomfort that can make it hard for you to  sleep.  Limit screen use before bedtime. This includes: ? Watching TV. ? Using your smartphone, tablet, and computer.  Stick to a routine. This can help you fall asleep faster. Try to do a quiet activity, brush your teeth, and go to bed at the same time each night.  Get out of bed if you are still awake after 15 minutes of trying to sleep. Keep the lights down, but try reading or doing a quiet activity. When you feel sleepy, go back to bed.  Make sure that you drive carefully. Avoid driving if you feel very sleepy.  Keep all follow-up appointments as directed by your health care provider. This is important. Contact a health care provider if:  You are tired throughout the day or have trouble in your daily routine due to sleepiness.  You continue to have sleep problems or your sleep problems get worse. Get help right away if:  You have serious thoughts about hurting yourself or someone else. This information is not intended to replace advice given to you by your health care provider. Make sure you discuss any questions you have with your health care provider. Document Released: 02/25/2000 Document Revised: 07/30/2015 Document Reviewed: 11/28/2013 Elsevier Interactive Patient Education  2018 [place] Maintenance, Female Adopting a healthy lifestyle and getting preventive care can go a long way to promote health and wellness. Talk with your health care provider about what schedule of regular examinations is right for you. This is a good chance for you to check in with your provider about disease prevention and staying healthy. In between checkups, there are plenty of things you can do on your own. Experts have done a lot of research about which lifestyle changes and preventive measures are most likely to keep you healthy. Ask your health care provider for more information. Weight and diet Eat a healthy diet  Be sure to include plenty of vegetables, fruits, low-fat dairy  products, and lean protein.  Do not eat a lot of foods high in solid fats, added sugars, or salt.  Get regular exercise. This is one of the most important things you can do for your health. ? Most adults should exercise for at least 150 minutes each week. The exercise should increase your heart rate and make you sweat (moderate-intensity exercise). ? Most adults should also do strengthening exercises at least twice a week. This is in addition to the moderate-intensity exercise.  Maintain a healthy weight  Body mass index (BMI) is a measurement that can be used to identify possible weight problems. It estimates body fat based on height and weight. Your health care provider can help determine your BMI and help you achieve or maintain a healthy weight.  For females 22 years of age and older: ? A BMI below 18.5 is considered underweight. ? A BMI of 18.5 to 24.9 is normal. ? A BMI of 25 to 29.9 is considered overweight. ? A BMI of 30 and above is considered obese.  Watch levels of cholesterol and blood lipids  You should start having your blood tested for lipids and cholesterol at 60 years of age, then have this test every 5 years.  You may need to have your cholesterol levels checked more often if: ?  Your lipid or cholesterol levels are high. ? You are older than 60 years of age. ? You are at high risk for heart disease.  Cancer screening Lung Cancer  Lung cancer screening is recommended for adults 90-62 years old who are at high risk for lung cancer because of a history of smoking.  A yearly low-dose CT scan of the lungs is recommended for people who: ? Currently smoke. ? Have quit within the past 15 years. ? Have at least a 30-pack-year history of smoking. A pack year is smoking an average of one pack of cigarettes a day for 1 year.  Yearly screening should continue until it has been 15 years since you quit.  Yearly screening should stop if you develop a health problem that would  prevent you from having lung cancer treatment.  Breast Cancer  Practice breast self-awareness. This means understanding how your breasts normally appear and feel.  It also means doing regular breast self-exams. Let your health care provider know about any changes, no matter how small.  If you are in your 20s or 30s, you should have a clinical breast exam (CBE) by a health care provider every 1-3 years as part of a regular health exam.  If you are 32 or older, have a CBE every year. Also consider having a breast X-ray (mammogram) every year.  If you have a family history of breast cancer, talk to your health care provider about genetic screening.  If you are at high risk for breast cancer, talk to your health care provider about having an MRI and a mammogram every year.  Breast cancer gene (BRCA) assessment is recommended for women who have family members with BRCA-related cancers. BRCA-related cancers include: ? Breast. ? Ovarian. ? Tubal. ? Peritoneal cancers.  Results of the assessment will determine the need for genetic counseling and BRCA1 and BRCA2 testing.  Cervical Cancer Your health care provider may recommend that you be screened regularly for cancer of the pelvic organs (ovaries, uterus, and vagina). This screening involves a pelvic examination, including checking for microscopic changes to the surface of your cervix (Pap test). You may be encouraged to have this screening done every 3 years, beginning at age 29.  For women ages 67-65, health care providers may recommend pelvic exams and Pap testing every 3 years, or they may recommend the Pap and pelvic exam, combined with testing for human papilloma virus (HPV), every 5 years. Some types of HPV increase your risk of cervical cancer. Testing for HPV may also be done on women of any age with unclear Pap test results.  Other health care providers may not recommend any screening for nonpregnant women who are considered low risk  for pelvic cancer and who do not have symptoms. Ask your health care provider if a screening pelvic exam is right for you.  If you have had past treatment for cervical cancer or a condition that could lead to cancer, you need Pap tests and screening for cancer for at least 20 years after your treatment. If Pap tests have been discontinued, your risk factors (such as having a new sexual partner) need to be reassessed to determine if screening should resume. Some women have medical problems that increase the chance of getting cervical cancer. In these cases, your health care provider may recommend more frequent screening and Pap tests.  Colorectal Cancer  This type of cancer can be detected and often prevented.  Routine colorectal cancer screening usually begins at 60 years  of age and continues through 60 years of age.  Your health care provider may recommend screening at an earlier age if you have risk factors for colon cancer.  Your health care provider may also recommend using home test kits to check for hidden blood in the stool.  A small camera at the end of a tube can be used to examine your colon directly (sigmoidoscopy or colonoscopy). This is done to check for the earliest forms of colorectal cancer.  Routine screening usually begins at age 75.  Direct examination of the colon should be repeated every 5-10 years through 60 years of age. However, you may need to be screened more often if early forms of precancerous polyps or small growths are found.  Skin Cancer  Check your skin from head to toe regularly.  Tell your health care provider about any new moles or changes in moles, especially if there is a change in a mole's shape or color.  Also tell your health care provider if you have a mole that is larger than the size of a pencil eraser.  Always use sunscreen. Apply sunscreen liberally and repeatedly throughout the day.  Protect yourself by wearing long sleeves, pants, a  wide-brimmed hat, and sunglasses whenever you are outside.  Heart disease, diabetes, and high blood pressure  High blood pressure causes heart disease and increases the risk of stroke. High blood pressure is more likely to develop in: ? People who have blood pressure in the high end of the normal range (130-139/85-89 mm Hg). ? People who are overweight or obese. ? People who are African American.  If you are 42-66 years of age, have your blood pressure checked every 3-5 years. If you are 65 years of age or older, have your blood pressure checked every year. You should have your blood pressure measured twice-once when you are at a hospital or clinic, and once when you are not at a hospital or clinic. Record the average of the two measurements. To check your blood pressure when you are not at a hospital or clinic, you can use: ? An automated blood pressure machine at a pharmacy. ? A home blood pressure monitor.  If you are between 59 years and 41 years old, ask your health care provider if you should take aspirin to prevent strokes.  Have regular diabetes screenings. This involves taking a blood sample to check your fasting blood sugar level. ? If you are at a normal weight and have a low risk for diabetes, have this test once every three years after 60 years of age. ? If you are overweight and have a high risk for diabetes, consider being tested at a younger age or more often. Preventing infection Hepatitis B  If you have a higher risk for hepatitis B, you should be screened for this virus. You are considered at high risk for hepatitis B if: ? You were born in a country where hepatitis B is common. Ask your health care provider which countries are considered high risk. ? Your parents were born in a high-risk country, and you have not been immunized against hepatitis B (hepatitis B vaccine). ? You have HIV or AIDS. ? You use needles to inject street drugs. ? You live with someone who has  hepatitis B. ? You have had sex with someone who has hepatitis B. ? You get hemodialysis treatment. ? You take certain medicines for conditions, including cancer, organ transplantation, and autoimmune conditions.  Hepatitis C  Blood  testing is recommended for: ? Everyone born from [birth year] through 1963. ? Anyone with known risk factors for hepatitis C.  Sexually transmitted infections (STIs)  You should be screened for sexually transmitted infections (STIs) including gonorrhea and chlamydia if: ? You are sexually active and are younger than 60 years of age. ? You are older than 60 years of age and your health care provider tells you that you are at risk for this type of infection. ? Your sexual activity has changed since you were last screened and you are at an increased risk for chlamydia or gonorrhea. Ask your health care provider if you are at risk.  If you do not have HIV, but are at risk, it may be recommended that you take a prescription medicine daily to prevent HIV infection. This is called pre-exposure prophylaxis (PrEP). You are considered at risk if: ? You are sexually active and do not regularly use condoms or know the HIV status of your partner(s). ? You take drugs by injection. ? You are sexually active with a partner who has HIV.  Talk with your health care provider about whether you are at high risk of being infected with HIV. If you choose to begin PrEP, you should first be tested for HIV. You should then be tested every 3 months for as long as you are taking PrEP. Pregnancy  If you are premenopausal and you may become pregnant, ask your health care provider about preconception counseling.  If you may become pregnant, take 400 to 800 micrograms (mcg) of folic acid every day.  If you want to prevent pregnancy, talk to your health care provider about birth control (contraception). Osteoporosis and menopause  Osteoporosis is a disease in which the bones lose minerals and  strength with aging. This can result in serious bone fractures. Your risk for osteoporosis can be identified using a bone density scan.  If you are 56 years of age or older, or if you are at risk for osteoporosis and fractures, ask your health care provider if you should be screened.  Ask your health care provider whether you should take a calcium or vitamin D supplement to lower your risk for osteoporosis.  Menopause may have certain physical symptoms and risks.  Hormone replacement therapy may reduce some of these symptoms and risks. Talk to your health care provider about whether hormone replacement therapy is right for you. Follow these instructions at home:  Schedule regular health, dental, and eye exams.  Stay current with your immunizations.  Do not use any tobacco products including cigarettes, chewing tobacco, or electronic cigarettes.  If you are pregnant, do not drink alcohol.  If you are breastfeeding, limit how much and how often you drink alcohol.  Limit alcohol intake to no more than 1 drink per day for nonpregnant women. One drink equals 12 ounces of beer, 5 ounces of wine, or 1 ounces of hard liquor.  Do not use street drugs.  Do not share needles.  Ask your health care provider for help if you need support or information about quitting drugs.  Tell your health care provider if you often feel depressed.  Tell your health care provider if you have ever been abused or do not feel safe at home. This information is not intended to replace advice given to you by your health care provider. Make sure you discuss any questions you have with your health care provider. Document Released: 09/12/2010 Document Revised: 08/05/2015 Document Reviewed: 12/01/2014 Elsevier Interactive Patient Education  2018 [employer]    Palpitations A palpitation is the feeling that your heartbeat is irregular or is faster than normal. It may feel like your heart is fluttering or  skipping a beat. Palpitations are usually not a serious problem. They may be caused by many things, including smoking, caffeine, alcohol, stress, and certain medicines. Although most causes of palpitations are not serious, palpitations can be a sign of a serious medical problem. In some cases, you may need further medical evaluation. Follow these instructions at home: Pay attention to any changes in your symptoms. Take these actions to help with your condition:  Avoid the following: ? Caffeinated coffee, tea, soft drinks, diet pills, and energy drinks. ? Chocolate. ? Alcohol.  Do not use any tobacco products, such as cigarettes, chewing tobacco, and e-cigarettes. If you need help quitting, ask your health care provider.  Try to reduce your stress and anxiety. Things that can help you relax include: ? Yoga. ? Meditation. ? Physical activity, such as swimming, jogging, or walking. ? Biofeedback. This is a method that helps you learn to use your mind to control things in your body, such as your heartbeats.  Get plenty of rest and sleep.  Take over-the-counter and prescription medicines only as told by your health care provider.  Keep all follow-up visits as told by your health care provider. This is important.  Contact a health care provider if:  You continue to have a fast or irregular heartbeat after 24 hours.  Your palpitations occur more often. Get help right away if:  You have chest pain or shortness of breath.  You have a severe headache.  You feel dizzy or you faint. This information is not intended to replace advice given to you by your health care provider. Make sure you discuss any questions you have with your health care provider. Document Released: 02/25/2000 Document Revised: 08/02/2015 Document Reviewed: 11/12/2014 Elsevier Interactive Patient Education  [employer].

## 2017-10-24 ENCOUNTER — Telehealth: Payer: Self-pay | Admitting: Internal Medicine

## 2017-10-24 DIAGNOSIS — R739 Hyperglycemia, unspecified: Secondary | ICD-10-CM

## 2017-10-24 DIAGNOSIS — R945 Abnormal results of liver function studies: Secondary | ICD-10-CM

## 2017-10-24 DIAGNOSIS — R7989 Other specified abnormal findings of blood chemistry: Secondary | ICD-10-CM

## 2017-10-24 NOTE — Telephone Encounter (Signed)
Blood sugar up some in prediabetic range   Liver levels slightly off again like A few years ago .  Marland Kitchen. Not alarming but Should be followed up . To be sure .  Check hep b aby and hep b ag , hep c aby , lfts panel And hg a1c  in A month  Dx abnormal lfts  And hyperglycemia NO ov needed at this time    I have called and spoke with pt and she is aware of lab results.  Pt will return to the office in 1 month and have her labs repeated.  Orders have been placed.  Nothing further is needed.

## 2017-10-24 NOTE — Telephone Encounter (Signed)
Copied from CRM [id number]. Topic: Quick Communication - See Telephone Encounter >> Oct 24, 2017 10:01 AM Arlyss Gandyichardson, Dionne Rossa N, NT wrote: CRM for notification. See Telephone encounter for: 10/24/17. Pt would like a call with her lab results. May leave a voicemail.

## 2017-10-29 DIAGNOSIS — Z79891 Long term (current) use of opiate analgesic: Secondary | ICD-10-CM | POA: Diagnosis not present

## 2017-10-29 DIAGNOSIS — G894 Chronic pain syndrome: Secondary | ICD-10-CM | POA: Diagnosis not present

## 2017-11-27 DIAGNOSIS — G894 Chronic pain syndrome: Secondary | ICD-10-CM | POA: Diagnosis not present

## 2017-11-27 DIAGNOSIS — Z79891 Long term (current) use of opiate analgesic: Secondary | ICD-10-CM | POA: Diagnosis not present

## 2017-11-30 ENCOUNTER — Other Ambulatory Visit: Payer: Self-pay | Admitting: Internal Medicine

## 2017-12-25 DIAGNOSIS — G894 Chronic pain syndrome: Secondary | ICD-10-CM | POA: Diagnosis not present

## 2017-12-25 DIAGNOSIS — Z79891 Long term (current) use of opiate analgesic: Secondary | ICD-10-CM | POA: Diagnosis not present

## 2018-01-14 ENCOUNTER — Telehealth: Payer: Self-pay | Admitting: *Deleted

## 2018-01-14 NOTE — Telephone Encounter (Signed)
  I do not advise  prophylaxis in this situation but if gets flu like illness,  advise be seen   Right away    Medicine can cause  Unwanted side effects.  Make sure has gotten  flu vaccine or  Document if given already

## 2018-01-14 NOTE — Telephone Encounter (Signed)
Please advise Dr Panosh, thanks.   

## 2018-01-14 NOTE — Telephone Encounter (Signed)
Copied from CRM [id number]. Topic: General - Other >> Jan 14, 2018 10:12 AM Ronney Lion A wrote: Reason for CRM: Pt called in wanting to know if she can have a prescription called in for Tamiflu. Pt says her daughter was recently diagnosed with the flu which turned into Pneumonia, and the pt was advised to call her provider and request a scrip for pre-caution.   Please advise

## 2018-01-16 NOTE — Telephone Encounter (Signed)
Spoke with patient, aware of rec's Pt is planning on getting her flu vaccine today.  Nothing further needed.

## 2018-01-22 DIAGNOSIS — Z79891 Long term (current) use of opiate analgesic: Secondary | ICD-10-CM | POA: Diagnosis not present

## 2018-01-22 DIAGNOSIS — G894 Chronic pain syndrome: Secondary | ICD-10-CM | POA: Diagnosis not present

## 2018-02-19 DIAGNOSIS — Z79891 Long term (current) use of opiate analgesic: Secondary | ICD-10-CM | POA: Diagnosis not present

## 2018-02-19 DIAGNOSIS — G894 Chronic pain syndrome: Secondary | ICD-10-CM | POA: Diagnosis not present

## 2018-03-21 ENCOUNTER — Ambulatory Visit: Payer: 59 | Admitting: Family Medicine

## 2018-03-21 ENCOUNTER — Encounter: Payer: Self-pay | Admitting: Family Medicine

## 2018-03-21 VITALS — BP 140/88 | HR 91 | Temp 97.8°F | Ht 62.25 in | Wt 150.1 lb

## 2018-03-21 DIAGNOSIS — J0100 Acute maxillary sinusitis, unspecified: Secondary | ICD-10-CM | POA: Diagnosis not present

## 2018-03-21 DIAGNOSIS — H66002 Acute suppurative otitis media without spontaneous rupture of ear drum, left ear: Secondary | ICD-10-CM | POA: Diagnosis not present

## 2018-03-21 MED ORDER — DOXYCYCLINE HYCLATE 100 MG PO TABS: 100.0000 mg | ORAL_TABLET | Freq: Two times a day (BID) | ORAL | 0 refills | Status: DC

## 2018-03-21 NOTE — Patient Instructions (Signed)
It was a pleasure to meet you today!  Please take medication with food as directed.  Please drink plenty of water so that your urine is pale yellow or clear. Also, get plenty of rest, use tylenol or ibuprofen as needed for discomfort and follow up if symptoms do not improve in 3 to 4 days, worsen, or you develop a fever >101.   Otitis Media, Adult  Otitis media means that the middle ear is red and swollen (inflamed) and full of fluid. The condition usually goes away on its own. Follow these instructions at home:  Take over-the-counter and prescription medicines only as told by your doctor.  If you were prescribed an antibiotic medicine, take it as told by your doctor. Do not stop taking the antibiotic even if you start to feel better.  Keep all follow-up visits as told by your doctor. This is important. Contact a doctor if:  You have bleeding from your nose.  There is a lump on your neck.  You are not getting better in 5 days.  You feel worse instead of better. Get help right away if:  You have pain that is not helped with medicine.  You have swelling, redness, or pain around your ear.  You get a stiff neck.  You cannot move part of your face (paralyzed).  You notice that the bone behind your ear hurts when you touch it.  You get a very bad headache. Summary  Otitis media means that the middle ear is red, swollen, and full of fluid.  This condition usually goes away on its own. In some cases, treatment may be needed.  If you were prescribed an antibiotic medicine, take it as told by your doctor. This information is not intended to replace advice given to you by your health care provider. Make sure you discuss any questions you have with your health care provider. Document Released: 08/16/2007 Document Revised: 03/20/2016 Document Reviewed: 03/20/2016 Elsevier Interactive Patient Education  2019 Elsevier Inc.  Sinusitis, Adult Sinusitis is soreness and swelling  (inflammation) of your sinuses. Sinuses are hollow spaces in the bones around your face. They are located:  Around your eyes.  In the middle of your forehead.  Behind your nose.  In your cheekbones. Your sinuses and nasal passages are lined with a fluid called mucus. Mucus drains out of your sinuses. Swelling can trap mucus in your sinuses. This lets germs (bacteria, virus, or fungus) grow, which leads to infection. Most of the time, this condition is caused by a virus. What are the causes? This condition is caused by:  Allergies.  Asthma.  Germs.  Things that block your nose or sinuses.  Growths in the nose (nasal polyps).  Chemicals or irritants in the air.  Fungus (rare). What increases the risk? You are more likely to develop this condition if:  You have a weak body defense system (immune system).  You do a lot of swimming or diving.  You use nasal sprays too much.  You smoke. What are the signs or symptoms? The main symptoms of this condition are pain and a feeling of pressure around the sinuses. Other symptoms include:  Stuffy nose (congestion).  Runny nose (drainage).  Swelling and warmth in the sinuses.  Headache.  Toothache.  A cough that may get worse at night.  Mucus that collects in the throat or the back of the nose (postnasal drip).  Being unable to smell and taste.  Being very tired (fatigue).  A fever.  Sore throat.  Bad breath. How is this diagnosed? This condition is diagnosed based on:  Your symptoms.  Your medical history.  A physical exam.  Tests to find out if your condition is short-term (acute) or long-term (chronic). Your doctor may: ? Check your nose for growths (polyps). ? Check your sinuses using a tool that has a light (endoscope). ? Check for allergies or germs. ? Do imaging tests, such as an MRI or CT scan. How is this treated? Treatment for this condition depends on the cause and whether it is short-term or  long-term.  If caused by a virus, your symptoms should go away on their own within 10 days. You may be given medicines to relieve symptoms. They include: ? Medicines that shrink swollen tissue in the nose. ? Medicines that treat allergies (antihistamines). ? A spray that treats swelling of the nostrils. ? Rinses that help get rid of thick mucus in your nose (nasal saline washes).  If caused by bacteria, your doctor may wait to see if you will get better without treatment. You may be given antibiotic medicine if you have: ? A very bad infection. ? A weak body defense system.  If caused by growths in the nose, you may need to have surgery. Follow these instructions at home: Medicines  Take, use, or apply over-the-counter and prescription medicines only as told by your doctor. These may include nasal sprays.  If you were prescribed an antibiotic medicine, take it as told by your doctor. Do not stop taking the antibiotic even if you start to feel better. Hydrate and humidify   Drink enough water to keep your pee (urine) pale yellow.  Use a cool mist humidifier to keep the humidity level in your home above 50%.  Breathe in steam for 10-15 minutes, 3-4 times a day, or as told by your doctor. You can do this in the bathroom while a hot shower is running.  Try not to spend time in cool or dry air. Rest  Rest as much as you can.  Sleep with your head raised (elevated).  Make sure you get enough sleep each night. General instructions   Put a warm, moist washcloth on your face 3-4 times a day, or as often as told by your doctor. This will help with discomfort.  Wash your hands often with soap and water. If there is no soap and water, use hand sanitizer.  Do not smoke. Avoid being around people who are smoking (secondhand smoke).  Keep all follow-up visits as told by your doctor. This is important. Contact a doctor if:  You have a fever.  Your symptoms get worse.  Your  symptoms do not get better within 10 days. Get help right away if:  You have a very bad headache.  You cannot stop throwing up (vomiting).  You have very bad pain or swelling around your face or eyes.  You have trouble seeing.  You feel confused.  Your neck is stiff.  You have trouble breathing. Summary  Sinusitis is swelling of your sinuses. Sinuses are hollow spaces in the bones around your face.  This condition is caused by tissues in your nose that become inflamed or swollen. This traps germs. These can lead to infection.  If you were prescribed an antibiotic medicine, take it as told by your doctor. Do not stop taking it even if you start to feel better.  Keep all follow-up visits as told by your doctor. This is important. This information  is not intended to replace advice given to you by your health care provider. Make sure you discuss any questions you have with your health care provider. Document Released: 08/16/2007 Document Revised: 07/30/2017 Document Reviewed: 07/30/2017 Elsevier Interactive Patient Education  2019 [employer].

## 2018-03-21 NOTE — Progress Notes (Signed)
Patient ID: Amanda [name], female   DOB: 09-04-1955, 63 y.o.   MRN: [id number]  PCP: Amanda Headings, MD  Subjective:  Amanda [name] is a 63 y.o. year old very pleasant female patient who presents with symptoms including nasal congestion, sinus pressure, post nasal drip, cough that is mildly productive of green sputum, left ear pain -started: 2 weeks , symptoms slightly improved but did not resolve and is now worsening -previous treatments: Robitussin -sick contacts/travel/risks: denies flu exposure. Recent sick contact exposure with 39 year old granddaughter -Hx of: allergies No influenza vaccine this season Smokes one pack per week No recent antibiotic use  ROS-denies fever, SOB, NVD, tooth pain  Pertinent Past Medical History- HTN, Tobacco User  Medications- reviewed  Current Outpatient Medications  Medication Sig Dispense Refill  . fluticasone (FLONASE) 50 MCG/ACT nasal spray Place 1 spray into both nostrils daily. 16 g 0  . lisinopril-hydrochlorothiazide (PRINZIDE,ZESTORETIC) 10-12.5 MG tablet TAKE 1 TABLET BY MOUTH DAILY 90 tablet 1  . ZUBSOLV 5.7-1.4 MG SUBL      No current facility-administered medications for this visit.     Objective: BP 140/88 (BP Location: Left Arm, Patient Position: Sitting, Cuff Size: Normal)   Pulse 91   Temp 97.8 F (36.6 C) (Oral)   Ht 5' 2.25" (1.581 m)   Wt 150 lb 1.9 oz (68.1 kg)   SpO2 99%   BMI 27.24 kg/m  Gen: NAD, resting comfortably HEENT: Turbinates mildly erythematous, Right TM normal, Left TM with erythema and mildly bulging, pharynx mildly erythematous with no exudate or edema, + maxillary sinus tenderness CV: RRR no murmurs rubs or gallops Lungs: CTAB no crackles, wheeze, rhonchi Ext: no edema Skin: warm, dry, no rash Neuro: grossly normal, moves all extremities  Assessment/Plan: 1. Acute maxillary sinusitis, recurrence not specified Duration of symptoms and double sickening history, sinus tenderness, congestion and  drainage present will treat with doxycycline. PCN allergy present.  - doxycycline (VIBRA-TABS) 100 MG tablet; Take 1 tablet (100 mg total) by mouth 2 (two) times daily.  Dispense: 20 tablet; Refill: 0  2. Acute suppurative otitis media of left ear without spontaneous rupture of tympanic membrane, recurrence not specified PCN allergy present; treat with doxycycline. Advised patient on supportive measures:  Get rest, drink plenty of fluids, and use tylenol or ibuprofen as needed for pain. Follow up if fever >101, if symptoms worsen or if symptoms are not improved in 3 days. Patient verbalizes understanding.      Finally, we reviewed reasons to return to care including if symptoms worsen or persist or new concerns arise- once again particularly fever, SOB, or worsening ear pain.    Inez Catalina, FNP

## 2018-03-26 DIAGNOSIS — Z79891 Long term (current) use of opiate analgesic: Secondary | ICD-10-CM | POA: Diagnosis not present

## 2018-03-26 DIAGNOSIS — G894 Chronic pain syndrome: Secondary | ICD-10-CM | POA: Diagnosis not present

## 2018-06-15 ENCOUNTER — Other Ambulatory Visit: Payer: Self-pay | Admitting: Internal Medicine

## 2018-11-08 ENCOUNTER — Other Ambulatory Visit: Payer: Self-pay

## 2018-11-08 DIAGNOSIS — Z20822 Contact with and (suspected) exposure to covid-19: Secondary | ICD-10-CM

## 2018-11-10 LAB — NOVEL CORONAVIRUS, NAA: SARS-CoV-2, NAA: NOT DETECTED

## 2019-01-22 ENCOUNTER — Other Ambulatory Visit: Payer: Self-pay | Admitting: Internal Medicine

## 2019-01-22 NOTE — Telephone Encounter (Signed)
Pt has been scheduled a virtual visit  

## 2019-01-23 NOTE — Progress Notes (Signed)
Virtual Visit via Video Note  I connected with@ on 11 13 2020 at  3:00 PM EST by a video enabled telemedicine application and verified that I am speaking with the correct person using two identifiers. Location patient: home car  Location provider:work  office Persons participating in the virtual visit: patient, provider  [place] national recommendations  regarding COVID 19 pandemic   video visit is advised over in office visit for this patient.  Patient aware  of the limitations of evaluation and management by telemedicine and  availability of in person appointments. and agreed to proceed.   HPI: Amanda [name] presents for video visit and med follow up Due for PV  And med check   HT: checking bp readings  About 2-3 x per week   Readings 120- 130/60 -70  Needs refill  Due for mammo and colon screen  Neg fam hx last colonscopy was clear and 10 year recall  Helps take care of grand child  Doing well  ROS: See pertinent positives and negatives per HPI. No cp sob feels well   Past Medical History:  Diagnosis Date  . Endometriosis    hysterectomy nad right oopherectomy  . H/O sciatica    left  . Infectious mononucleosis hepatitis   . Narcotic dependence, in remission [facility])     Past Surgical History:  Procedure Laterality Date  . ABDOMINAL HYSTERECTOMY    . BUNIONECTOMY     Both feet 25 years ago  . CHOLECYSTECTOMY    . TOTAL VAGINAL HYSTERECTOMY  2002    Family History  Problem Relation Age of Onset  . Diabetes Mother   . Heart disease Mother 56       heart attack in 39s   . Hyperlipidemia Mother   . Hypertension Mother   . Hypertension Father   . Kidney disease Father   . Vascular Disease Father   . Diabetes Brother   . Crohn's disease Brother   . Heart disease Maternal Grandmother 9       MI age 60    Social History   Tobacco Use  . Smoking status: Current Every Day Smoker  . Smokeless tobacco: Never Used  . Tobacco comment: 3 cigs daily as of 10/17/17   Substance Use Topics  . Alcohol use: Yes  . Drug use: Never      Current Outpatient Medications:  .  doxycycline (VIBRA-TABS) 100 MG tablet, Take 1 tablet (100 mg total) by mouth 2 (two) times daily., Disp: 20 tablet, Rfl: 0 .  fluticasone (FLONASE) 50 MCG/ACT nasal spray, Place 1 spray into both nostrils daily., Disp: 16 g, Rfl: 0 .  lisinopril-hydrochlorothiazide (ZESTORETIC) 10-12.5 MG tablet, Take 1 tablet by mouth daily., Disp: 90 tablet, Rfl: 0 .  ZUBSOLV 5.7-1.4 MG SUBL, , Disp: , Rfl:   EXAM: BP Readings from Last 3 Encounters:  03/21/18 140/88  10/17/17 (!) 142/84  03/19/17 140/80    VITALS per patient if applicable:  GENERAL: alert, oriented, appears well and in no acute distress  HEENT: atraumatic, conjunttiva clear, no obvious abnormalities on inspection of external nose and ears  NECK: normal movements of the head and neck  LUNGS: on inspection no signs of respiratory distress, breathing rate appears normal, no obvious gross SOB, gasping or wheezing  CV: no obvious cyanosis  PSYCH/NEURO: pleasant and cooperative, no obvious depression or anxiety, speech and thought processing grossly intact Lab Results  Component Value Date   WBC 4.9 10/17/2017   HGB 13.1 10/17/2017  HCT 38.5 10/17/2017   PLT 171.0 10/17/2017   GLUCOSE 117 (H) 10/17/2017   CHOL 201 (H) 10/17/2017   TRIG 72.0 10/17/2017   HDL 55.30 10/17/2017   LDLDIRECT 144.9 01/26/2012   LDLCALC 132 (H) 10/17/2017   ALT 50 (H) 10/17/2017   AST 47 (H) 10/17/2017   NA 139 10/17/2017   K 4.2 10/17/2017   CL 101 10/17/2017   CREATININE 0.70 10/17/2017   BUN 8 10/17/2017   CO2 33 (H) 10/17/2017   TSH 2.01 10/17/2017   HGBA1C 5.9 04/30/2013    ASSESSMENT AND PLAN:  Discussed the following assessment and plan:    ICD-10-CM   1. Essential hypertension  I10 Basic metabolic panel    CBC with Differential    Hemoglobin A1c    Hepatic function panel    Lipid panel    TSH  2. lab for preventive  visit   Z00.00 Basic metabolic panel    CBC with Differential    Hemoglobin A1c    Hepatic function panel    Lipid panel    TSH  3. Medication management  Z79.899 Basic metabolic panel    CBC with Differential    Hemoglobin A1c    Hepatic function panel    Lipid panel    TSH  4. Hyperglycemia  R73.9 Basic metabolic panel    CBC with Differential    Hemoglobin A1c    Hepatic function panel    Lipid panel    TSH  5. Hyperlipidemia, unspecified hyperlipidemia type  E78.5 Basic metabolic panel    CBC with Differential    Hemoglobin A1c    Hepatic function panel    Lipid panel    TSH  6. Need for hepatitis C screening test  Z11.59 6 mos Hepatitis C antibody  refill med x 90 days and get lab and cpx Counseled.   Expectant management and discussion of plan and treatment with opportunity to ask questions and all were answered. The patient agreed with the plan and demonstrated an understanding of the instructions. Return for appt for fasting blood work and then cpx  call about cologuard order  for screening .   Advised to call back or seek an in-person evaluation if worsening  or having  further concerns . In interim  Berniece Andreas, MD

## 2019-01-24 ENCOUNTER — Encounter: Payer: Self-pay | Admitting: Internal Medicine

## 2019-01-24 ENCOUNTER — Telehealth (INDEPENDENT_AMBULATORY_CARE_PROVIDER_SITE_OTHER): Payer: 59 | Admitting: Internal Medicine

## 2019-01-24 ENCOUNTER — Other Ambulatory Visit: Payer: Self-pay

## 2019-01-24 DIAGNOSIS — I10 Essential (primary) hypertension: Secondary | ICD-10-CM | POA: Diagnosis not present

## 2019-01-24 DIAGNOSIS — Z1159 Encounter for screening for other viral diseases: Secondary | ICD-10-CM

## 2019-01-24 DIAGNOSIS — Z79899 Other long term (current) drug therapy: Secondary | ICD-10-CM

## 2019-01-24 DIAGNOSIS — R739 Hyperglycemia, unspecified: Secondary | ICD-10-CM

## 2019-01-24 DIAGNOSIS — Z0000 Encounter for general adult medical examination without abnormal findings: Secondary | ICD-10-CM | POA: Diagnosis not present

## 2019-01-24 DIAGNOSIS — E785 Hyperlipidemia, unspecified: Secondary | ICD-10-CM

## 2019-01-24 MED ORDER — LISINOPRIL-HYDROCHLOROTHIAZIDE 10-12.5 MG PO TABS: 1.0000 | ORAL_TABLET | Freq: Every day | ORAL | 0 refills | Status: DC

## 2019-04-23 ENCOUNTER — Other Ambulatory Visit: Payer: Self-pay | Admitting: Internal Medicine

## 2019-05-22 ENCOUNTER — Ambulatory Visit: Payer: 59 | Attending: Internal Medicine

## 2019-05-22 DIAGNOSIS — Z23 Encounter for immunization: Secondary | ICD-10-CM

## 2019-05-22 NOTE — Progress Notes (Signed)
   Covid-19 Vaccination Clinic  Name:  Amanda [name]    MRN: [id number] DOB: Jul 07, 1955  05/22/2019  [name] was observed post Covid-19 immunization for 15 minutes without incident. She was provided with Vaccine Information Sheet and instruction to access the V-Safe system.   [name] was instructed to call 911 with any severe reactions post vaccine: Marland Kitchen Difficulty breathing  . Swelling of face and throat  . A fast heartbeat  . A bad rash all over body  . Dizziness and weakness   Immunizations Administered    Name Date Dose VIS Date Route   Pfizer COVID-19 Vaccine 05/22/2019 11:10 AM 0.3 mL 02/21/2019 Intramuscular   Manufacturer: [employer], [employer]   Lot: [id number]   NDC: [address]-5

## 2019-06-16 ENCOUNTER — Ambulatory Visit: Payer: 59 | Attending: Internal Medicine

## 2019-06-16 DIAGNOSIS — Z23 Encounter for immunization: Secondary | ICD-10-CM

## 2019-06-16 NOTE — Progress Notes (Signed)
   Covid-19 Vaccination Clinic  Name:  Amanda [name]    MRN: [id number] DOB: 03-28-1955  06/16/2019  [name] was observed post Covid-19 immunization for 15 minutes without incident. She was provided with Vaccine Information Sheet and instruction to access the V-Safe system.   [name] was instructed to call 911 with any severe reactions post vaccine: Marland Kitchen Difficulty breathing  . Swelling of face and throat  . A fast heartbeat  . A bad rash all over body  . Dizziness and weakness   Immunizations Administered    Name Date Dose VIS Date Route   Pfizer COVID-19 Vaccine 06/16/2019  1:02 PM 0.3 mL 02/21/2019 Intramuscular   Manufacturer: [employer], [employer]   Lot: [id number]   NDC: [address]-9

## 2019-08-10 ENCOUNTER — Other Ambulatory Visit: Payer: Self-pay | Admitting: Internal Medicine

## 2019-08-21 ENCOUNTER — Encounter: Payer: Self-pay | Admitting: Internal Medicine

## 2019-08-21 ENCOUNTER — Other Ambulatory Visit (INDEPENDENT_AMBULATORY_CARE_PROVIDER_SITE_OTHER): Payer: 59

## 2019-08-21 ENCOUNTER — Telehealth (INDEPENDENT_AMBULATORY_CARE_PROVIDER_SITE_OTHER): Payer: 59 | Admitting: Internal Medicine

## 2019-08-21 ENCOUNTER — Other Ambulatory Visit: Payer: Self-pay

## 2019-08-21 VITALS — BP 130/82 | Temp 98.8°F | Ht 64.5 in | Wt 149.0 lb

## 2019-08-21 DIAGNOSIS — R3989 Other symptoms and signs involving the genitourinary system: Secondary | ICD-10-CM

## 2019-08-21 LAB — URINALYSIS, ROUTINE W REFLEX MICROSCOPIC
Bilirubin Urine: NEGATIVE
Ketones, ur: NEGATIVE
Nitrite: NEGATIVE
Specific Gravity, Urine: <=1.005 — AB (ref 1.000–1.030)
Total Protein, Urine: NEGATIVE
Urine Glucose: NEGATIVE
Urobilinogen, UA: 0.2 (ref 0.0–1.0)
pH: 6 (ref 5.0–8.0)

## 2019-08-21 MED ORDER — SULFAMETHOXAZOLE-TRIMETHOPRIM 800-160 MG PO TABS
1.0000 | ORAL_TABLET | Freq: Two times a day (BID) | ORAL | 0 refills | Status: DC
Start: 2019-08-21 — End: 2019-10-03

## 2019-08-21 NOTE — Progress Notes (Signed)
Virtual Visit via Video Note  I connected with@ on 08/21/19 at 10:30 AM EDT by a video enabled telemedicine application and verified that I am speaking with the correct person using two identifiers. Location patient: home Location provider:home office Persons participating in the virtual visit: patient, provider  [place] national recommendations  regarding COVID 19 pandemic   video visit is advised over in office visit for this patient.  Patient aware  of the limitations of evaluation and management by telemedicine and  availability of in person appointments. and agreed to proceed.   HPI: Amanda [name] presents for video visit sda  Has hx of uti  A few years ago  In past few days some urgency frequency and not feeling well with lbp  No fever  . Has azo strop pos ++ for wbc ? If neg for nitrites  Had vaccine  But exposed  recently staying safe but no sx   No abd pain  ROS: See pertinent positives and negatives per HPI.  Past Medical History:  Diagnosis Date  . Endometriosis    hysterectomy nad right oopherectomy  . H/O sciatica    left  . Infectious mononucleosis hepatitis   . Narcotic dependence, in remission [facility])     Past Surgical History:  Procedure Laterality Date  . ABDOMINAL HYSTERECTOMY    . BUNIONECTOMY     Both feet 25 years ago  . CHOLECYSTECTOMY    . TOTAL VAGINAL HYSTERECTOMY  2002    Family History  Problem Relation Age of Onset  . Diabetes Mother   . Heart disease Mother 70       heart attack in 29s   . Hyperlipidemia Mother   . Hypertension Mother   . Hypertension Father   . Kidney disease Father   . Vascular Disease Father   . Diabetes Brother   . Crohn's disease Brother   . Heart disease Maternal Grandmother 81       MI age 64    Social History   Tobacco Use  . Smoking status: Current Every Day Smoker  . Smokeless tobacco: Never Used  . Tobacco comment: 3 cigs daily as of 10/17/17  Vaping Use  . Vaping Use: Never used  Substance Use  Topics  . Alcohol use: Yes  . Drug use: Never      Current Outpatient Medications:  .  lisinopril-hydrochlorothiazide (ZESTORETIC) 10-12.5 MG tablet, Take 1 tablet by mouth daily. PLEASE SCHEDULE PHYSICAL WITH LABS FOR REFILLS., Disp: 30 tablet, Rfl: 0 .  ZUBSOLV 5.7-1.4 MG SUBL, , Disp: , Rfl:  .  doxycycline (VIBRA-TABS) 100 MG tablet, Take 1 tablet (100 mg total) by mouth 2 (two) times daily. (Patient not taking: Reported on 08/21/2019), Disp: 20 tablet, Rfl: 0 .  fluticasone (FLONASE) 50 MCG/ACT nasal spray, Place 1 spray into both nostrils daily. (Patient not taking: Reported on 08/21/2019), Disp: 16 g, Rfl: 0 .  sulfamethoxazole-trimethoprim (BACTRIM DS) 800-160 MG tablet, Take 1 tablet by mouth 2 (two) times daily. For uti, Disp: 10 tablet, Rfl: 0  EXAM: BP Readings from Last 3 Encounters:  08/21/19 130/82  03/21/18 140/88  10/17/17 (!) 142/84    VITALS per patient if applicable:  GENERAL: alert, oriented, appears well and in no acute distress  HEENT: atraumatic, conjunttiva clear, no obvious abnormalities on inspection of external nose and ears  NECK: normal movements of the head and neck  LUNGS: on inspection no signs of respiratory distress, breathing rate appears normal, no obvious gross SOB, gasping  or wheezing  CV: no obvious cyanosis  MS: moves all visible extremities without noticeable abnormality  PSYCH/NEURO: pleasant and cooperative, no obvious depression or anxiety, speech and thought processing grossly intact   ASSESSMENT AND PLAN:  Discussed the following assessment and plan:    ICD-10-CM   1. Suspected UTI  R39.89 Urinalysis, Routine w reflex microscopic    Urine Culture   Over due for lab  Last t v said labs and pv spx  From October and not done yet  Plan ua and cx and empiric rx for now  She will make cpx appt  And I will put in lab orders  She can get done pre visit when she comes back from the beach vacation Counseled.   Expectant management  and discussion of plan and treatment with opportunity to ask questions and all were answered. The patient agreed with the plan and demonstrated an understanding of the instructions.   Advised to call back or seek an in-person evaluation if worsening  or having  further concerns . Return if symptoms worsen or fail to improve, for lab and cpx.    Shanon Ace, MD

## 2019-08-22 LAB — URINE CULTURE
MICRO NUMBER:: 10576204
Result:: NO GROWTH
SPECIMEN QUALITY:: ADEQUATE

## 2019-08-24 NOTE — Progress Notes (Signed)
Culture shows no bacteria   so no confirmed uti  Can stop antibiotic after 3 days

## 2019-08-26 ENCOUNTER — Other Ambulatory Visit: Payer: Self-pay | Admitting: Internal Medicine

## 2019-09-08 ENCOUNTER — Other Ambulatory Visit: Payer: Self-pay | Admitting: Internal Medicine

## 2019-09-21 ENCOUNTER — Other Ambulatory Visit: Payer: Self-pay | Admitting: Internal Medicine

## 2019-10-03 ENCOUNTER — Encounter: Payer: Self-pay | Admitting: Family Medicine

## 2019-10-03 ENCOUNTER — Other Ambulatory Visit: Payer: Self-pay

## 2019-10-03 ENCOUNTER — Ambulatory Visit: Payer: 59 | Admitting: Family Medicine

## 2019-10-03 ENCOUNTER — Telehealth: Payer: Self-pay | Admitting: Internal Medicine

## 2019-10-03 VITALS — BP 130/82 | HR 88 | Temp 98.5°F | Wt 149.0 lb

## 2019-10-03 DIAGNOSIS — N760 Acute vaginitis: Secondary | ICD-10-CM | POA: Diagnosis not present

## 2019-10-03 LAB — POC URINALSYSI DIPSTICK (AUTOMATED)
Bilirubin, UA: NEGATIVE
Blood, UA: NEGATIVE
Glucose, UA: NEGATIVE
Ketones, UA: NEGATIVE
Leukocytes, UA: NEGATIVE
Nitrite, UA: NEGATIVE
Protein, UA: NEGATIVE
Spec Grav, UA: 1.01 (ref 1.010–1.025)
Urobilinogen, UA: 0.2 E.U./dL
pH, UA: 6 (ref 5.0–8.0)

## 2019-10-03 MED ORDER — LISINOPRIL-HYDROCHLOROTHIAZIDE 10-12.5 MG PO TABS: 1.0000 | ORAL_TABLET | Freq: Every day | ORAL | 0 refills | Status: DC

## 2019-10-03 NOTE — Telephone Encounter (Signed)
Pt is calling in stating that she is out of her lisinopril-hydrochlorothiazide 10-12.5 MG and would like to see if she can get enough until her appointment for a CPE 10/08/2019 @11 :00 a.m.  Pharm:  [facility] on [address]

## 2019-10-03 NOTE — Progress Notes (Signed)
Subjective:    Patient ID: Amanda [name], female    DOB: 1955-07-21, 64 y.o.   MRN: [id number]  No chief complaint on file.   HPI Pt was seen today for acute concern.  Pt endorses mild tingling sensation with urination.  Patient denies nausea, vomiting, increased back pain, suprapubic pain/pressure.  Patient states she is unsure if she is developing a UTI as she is headed out of the country next week.  Pt notes using a different body wash which has caused irritation in the past.  Typically uses Caress but used her daughter's sauve body wash.  Past Medical History:  Diagnosis Date   Endometriosis    hysterectomy nad right oopherectomy   H/O sciatica    left   Infectious mononucleosis hepatitis    Narcotic dependence, in remission (HCC)     Allergies  Allergen Reactions   Aleve [Naproxen Sodium] Other (See Comments)    Racing heart  Can take ibuprofen   Atorvastatin     REACTION: myalgias   Penicillins Rash    ROS General: Denies fever, chills, night sweats, changes in weight, changes in appetite HEENT: Denies headaches, ear pain, changes in vision, rhinorrhea, sore throat CV: Denies CP, palpitations, SOB, orthopnea Pulm: Denies SOB, cough, wheezing GI: Denies abdominal pain, nausea, vomiting, diarrhea, constipation GU: Denies dysuria, hematuria, frequency, vaginal discharge  + vaginal irritation Msk: Denies muscle cramps, joint pains Neuro: Denies weakness, numbness, tingling Skin: Denies rashes, bruising Psych: Denies depression, anxiety, hallucinations     Objective:    Blood pressure (!) 130/82, pulse 88, temperature 98.5 F (36.9 C), temperature source Oral, weight 149 lb (67.6 kg), SpO2 97 %.   Gen. Pleasant, well-nourished, in no distress, normal affect   HEENT: [place]/AT, face symmetric, no scleral icterus, PERRLA, EOMI, nares patent without drainage Lungs: no accessory muscle use Cardiovascular: RRR, no peripheral edema Musculoskeletal: No deformities, no  cyanosis or clubbing, normal tone Neuro:  A&Ox3, CN II-XII intact, normal gait Skin:  Warm, no lesions/ rash   Wt Readings from Last 3 Encounters:  10/03/19 149 lb (67.6 kg)  08/21/19 149 lb (67.6 kg)  03/21/18 150 lb 1.9 oz (68.1 kg)    Lab Results  Component Value Date   WBC 4.9 10/17/2017   HGB 13.1 10/17/2017   HCT 38.5 10/17/2017   PLT 171.0 10/17/2017   GLUCOSE 117 (H) 10/17/2017   CHOL 201 (H) 10/17/2017   TRIG 72.0 10/17/2017   HDL 55.30 10/17/2017   LDLDIRECT 144.9 01/26/2012   LDLCALC 132 (H) 10/17/2017   ALT 50 (H) 10/17/2017   AST 47 (H) 10/17/2017   NA 139 10/17/2017   K 4.2 10/17/2017   CL 101 10/17/2017   CREATININE 0.70 10/17/2017   BUN 8 10/17/2017   CO2 33 (H) 10/17/2017   TSH 2.01 10/17/2017   HGBA1C 5.9 04/30/2013    Assessment/Plan:  Acute vaginitis  -Likely 2/2 changing body wash. -Patient advised to use gentle soaps, lotions, detergents -Continue drinking plenty of water daily -UA without infection -Continue to monitor  F/u prn  Abbe Amsterdam, MD

## 2019-10-03 NOTE — Telephone Encounter (Signed)
I have sent a small refill to her pharmacy.

## 2019-10-03 NOTE — Addendum Note (Signed)
Addended by: Carola Rhine on: 10/03/2019 03:29 PM   Modules accepted: Orders

## 2019-10-08 ENCOUNTER — Ambulatory Visit (INDEPENDENT_AMBULATORY_CARE_PROVIDER_SITE_OTHER): Payer: 59 | Admitting: Internal Medicine

## 2019-10-08 ENCOUNTER — Encounter: Payer: Self-pay | Admitting: Internal Medicine

## 2019-10-08 ENCOUNTER — Other Ambulatory Visit: Payer: Self-pay

## 2019-10-08 VITALS — BP 132/78 | HR 77 | Temp 98.5°F | Ht 62.75 in | Wt 151.0 lb

## 2019-10-08 DIAGNOSIS — I10 Essential (primary) hypertension: Secondary | ICD-10-CM

## 2019-10-08 DIAGNOSIS — E785 Hyperlipidemia, unspecified: Secondary | ICD-10-CM | POA: Diagnosis not present

## 2019-10-08 DIAGNOSIS — Z0000 Encounter for general adult medical examination without abnormal findings: Secondary | ICD-10-CM | POA: Diagnosis not present

## 2019-10-08 DIAGNOSIS — R739 Hyperglycemia, unspecified: Secondary | ICD-10-CM | POA: Diagnosis not present

## 2019-10-08 DIAGNOSIS — Z720 Tobacco use: Secondary | ICD-10-CM

## 2019-10-08 DIAGNOSIS — Z79899 Other long term (current) drug therapy: Secondary | ICD-10-CM

## 2019-10-08 MED ORDER — LISINOPRIL-HYDROCHLOROTHIAZIDE 10-12.5 MG PO TABS: 1.0000 | ORAL_TABLET | Freq: Every day | ORAL | 1 refills | Status: DC

## 2019-10-08 NOTE — Patient Instructions (Addendum)
Let us know if we cna order colo guard for colon screening .  Stop tobacco . Continue lifestyle intervention healthy eating and exercise .  Get appt for fasting labs   Orders are in record .   Health Maintenance, Female Adopting a healthy lifestyle and getting preventive care are important in promoting health and wellness. Ask your health care provider about:  The right schedule for you to have regular tests and exams.  Things you can do on your own to prevent diseases and keep yourself healthy. What should I know about diet, weight, and exercise? Eat a healthy diet   Eat a diet that includes plenty of vegetables, fruits, low-fat dairy products, and lean protein.  Do not eat a lot of foods that are high in solid fats, added sugars, or sodium. Maintain a healthy weight Body mass index (BMI) is used to identify weight problems. It estimates body fat based on height and weight. Your health care provider can help determine your BMI and help you achieve or maintain a healthy weight. Get regular exercise Get regular exercise. This is one of the most important things you can do for your health. Most adults should:  Exercise for at least 150 minutes each week. The exercise should increase your heart rate and make you sweat (moderate-intensity exercise).  Do strengthening exercises at least twice a week. This is in addition to the moderate-intensity exercise.  Spend less time sitting. Even light physical activity can be beneficial. Watch cholesterol and blood lipids Have your blood tested for lipids and cholesterol at 62 years of age, then have this test every 5 years. Have your cholesterol levels checked more often if:  Your lipid or cholesterol levels are high.  You are older than 62 years of age.  You are at high risk for heart disease. What should I know about cancer screening? Depending on your health history and family history, you may need to have cancer screening at various  ages. This may include screening for:  Breast cancer.  Cervical cancer.  Colorectal cancer.  Skin cancer.  Lung cancer. What should I know about heart disease, diabetes, and high blood pressure? Blood pressure and heart disease  High blood pressure causes heart disease and increases the risk of stroke. This is more likely to develop in people who have high blood pressure readings, are of African descent, or are overweight.  Have your blood pressure checked: ? Every 3-5 years if you are 33-74 years of age. ? Every year if you are 68 years old or older. Diabetes Have regular diabetes screenings. This checks your fasting blood sugar level. Have the screening done:  Once every three years after age 39 if you are at a normal weight and have a low risk for diabetes.  More often and at a younger age if you are overweight or have a high risk for diabetes. What should I know about preventing infection? Hepatitis B If you have a higher risk for hepatitis B, you should be screened for this virus. Talk with your health care provider to find out if you are at risk for hepatitis B infection. Hepatitis C Testing is recommended for:  Everyone born from [birth year] through 1965.  Anyone with known risk factors for hepatitis C. Sexually transmitted infections (STIs)  Get screened for STIs, including gonorrhea and chlamydia, if: ? You are sexually active and are younger than 62 years of age. ? You are older than 62 years of age and your health care  provider tells you that you are at risk for this type of infection. ? Your sexual activity has changed since you were last screened, and you are at increased risk for chlamydia or gonorrhea. Ask your health care provider if you are at risk.  Ask your health care provider about whether you are at high risk for HIV. Your health care provider may recommend a prescription medicine to help prevent HIV infection. If you choose to take medicine to prevent HIV, you  should first get tested for HIV. You should then be tested every 3 months for as long as you are taking the medicine. Pregnancy  If you are about to stop having your period (premenopausal) and you may become pregnant, seek counseling before you get pregnant.  Take 400 to 800 micrograms (mcg) of folic acid every day if you become pregnant.  Ask for birth control (contraception) if you want to prevent pregnancy. Osteoporosis and menopause Osteoporosis is a disease in which the bones lose minerals and strength with aging. This can result in bone fractures. If you are 68 years old or older, or if you are at risk for osteoporosis and fractures, ask your health care provider if you should:  Be screened for bone loss.  Take a calcium or vitamin D supplement to lower your risk of fractures.  Be given hormone replacement therapy (HRT) to treat symptoms of menopause. Follow these instructions at home: Lifestyle  Do not use any products that contain nicotine or tobacco, such as cigarettes, e-cigarettes, and chewing tobacco. If you need help quitting, ask your health care provider.  Do not use street drugs.  Do not share needles.  Ask your health care provider for help if you need support or information about quitting drugs. Alcohol use  Do not drink alcohol if: ? Your health care provider tells you not to drink. ? You are pregnant, may be pregnant, or are planning to become pregnant.  If you drink alcohol: ? Limit how much you use to 0-1 drink a day. ? Limit intake if you are breastfeeding.  Be aware of how much alcohol is in your drink. In the [place] one drink equals one 12 oz bottle of beer (355 mL), one 5 oz glass of wine (148 mL), or one 1 oz glass of hard liquor (44 mL). General instructions  Schedule regular health, dental, and eye exams.  Stay current with your vaccines.  Tell your health care provider if: ? You often feel depressed. ? You have ever been abused or do not feel  safe at home. Summary  Adopting a healthy lifestyle and getting preventive care are important in promoting health and wellness.  Follow your health care provider's instructions about healthy diet, exercising, and getting tested or screened for diseases.  Follow your health care provider's instructions on monitoring your cholesterol and blood pressure. This information is not intended to replace advice given to you by your health care provider. Make sure you discuss any questions you have with your health care provider. Document Revised: 02/20/2018 Document Reviewed: 02/20/2018 Elsevier Patient Education  2020 [employer].

## 2019-10-08 NOTE — Progress Notes (Signed)
Chief Complaint  Patient presents with  . Annual Exam    Doing well    HPI: Patient  Amanda [name]  64 y.o. comes in today for Preventive Health Care visit  And med check  Bp seems ok needs refill med  At psych 130/82 reading Retired 2 weeks ago and going on a cruise   Had vaccine  Still tob acco 5 per day  Still not getting info about colo guard  Neg fam hx  Hx of med in past ? lipitor and had terrible leg cramps   Health Maintenance  Topic Date Due  . Hepatitis C Screening  Never done  . HIV Screening  Never done  . MAMMOGRAM  Never done  . COLONOSCOPY  Never done  . TETANUS/TDAP  03/13/2017  . INFLUENZA VACCINE  10/12/2019  . 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 GI /GU: No adominal pain, vomiting, change in bowel habits. No blood in the stool. No significant GU symptoms. SKIN/HEME: ,no acute skin rashes suspicious lesions or bleeding. No lymphadenopathy, nodules, masses.  NEURO/ PSYCH:  No neurologic signs such as weakness numbness. No depression anxiety. IMM/ Allergy: No unusual infections.  Allergy .   REST of 12 system review negative except as per HPI Mom had hx of  Early cad but lived until ? 90    Past Medical History:  Diagnosis Date  . Endometriosis    hysterectomy nad right oopherectomy  . H/O sciatica    left  . Infectious mononucleosis hepatitis   . Narcotic dependence, in remission [facility])     Past Surgical History:  Procedure Laterality Date  . ABDOMINAL HYSTERECTOMY    . BUNIONECTOMY     Both feet 25  years ago  . CHOLECYSTECTOMY    . TOTAL VAGINAL HYSTERECTOMY  2002    Family History  Problem Relation Age of Onset  . Diabetes Mother   . Heart disease Mother 16       heart attack in 1s   . Hyperlipidemia Mother   . Hypertension Mother   . Hypertension Father   . Kidney disease Father   . Vascular Disease Father   . Diabetes Brother   . Crohn's disease Brother   . Heart disease Maternal Grandmother 3       MI age 64    Social History   Socioeconomic History  . Marital status: Married    Spouse name: Not on file  . Number of children: Not on file  . Years of education: Not on file  . Highest education level: Not on file  Occupational History  . Not on file  Tobacco Use  . Smoking status: Current Every Day Smoker  . Smokeless tobacco: Never Used  . Tobacco comment: 3 cigs daily as of 10/17/17  Vaping Use  . Vaping Use: Never used  Substance and Sexual Activity  . Alcohol use: Yes  . Drug use: Never  . Sexual activity: Not on file  Other Topics Concern  . Not on  file  Social History Narrative   hh of 3 married     Tobacco    No etoh   3-4 caffiene.   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 Difficulty of Paying Living Expenses:   Food Insecurity:   . Worried About Programme researcher, broadcasting/film/video in the Last Year:   . Barista in the Last Year:   Transportation Needs:   . Freight forwarder (Medical):   Marland Kitchen Lack of Transportation (Non-Medical):   Physical Activity:   . Days of Exercise per Week:   . Minutes of Exercise per Session:   Stress:   . Feeling of Stress :   Social Connections:   . Frequency of Communication with Friends and Family:   . Frequency of Social Gatherings with Friends and Family:   . Attends Religious Services:   . Active Member of Clubs or Organizations:   . Attends Banker Meetings:   Marland Kitchen Marital Status:      Outpatient Medications Prior to Visit  Medication Sig Dispense Refill  . ZUBSOLV 5.7-1.4 MG SUBL     . lisinopril-hydrochlorothiazide (ZESTORETIC) 10-12.5 MG tablet Take 1 tablet by mouth daily. Please keep upcoming appointment. Thank you! 15 tablet 0   No facility-administered medications prior to visit.     EXAM:  BP (!) 132/78   Pulse 77   Temp 98.5 F (36.9 C) (Oral)   Ht 5' 2.75" (1.594 m)   Wt 151 lb (68.5 kg)   SpO2 98%   BMI 26.96 kg/m   Body mass index is 26.96 kg/m. Wt Readings from Last 3 Encounters:  10/08/19 151 lb (68.5 kg)  10/03/19 149 lb (67.6 kg)  08/21/19 149 lb (67.6 kg)    Physical Exam: Vital signs reviewed JAS:NKNL is a well-developed well-nourished alert cooperative    who appearsr stated age in no acute distress.  HEENT: normocephalic atraumatic , Eyes: PERRL EOM's full, conjunctiva clear, Nares: paten,t no deformity discharge or tenderness., Ears: no deformity EAC's clear TMs with normal landmarks. Mouth: masked  NECK: supple without masses, thyromegaly or bruits. CHEST/PULM:  Clear to auscultation and percussion breath sounds equal no wheeze , rales or rhonchi. No chest wall deformities or tenderness. Breast: normal by inspection . No dimpling, discharge, masses, tenderness or discharge . CV: PMI is nondisplaced, S1 S2 no gallops, murmurs, rubs. Peripheral pulses are full without delay.No JVD .  ABDOMEN: Bowel sounds normal nontender  No guard or rebound, no hepato splenomegal no CVA tenderness.  No hernia. Extremtities:  No clubbing cyanosis or edema, no acute joint swelling or redness no focal atrophy NEURO:  Oriented x3, cranial nerves 3-12 appear to be intact, no obvious focal weakness,gait within normal limits no abnormal reflexes or asymmetrical SKIN: No acute rashes normal turgor, color, no bruising or petechiae. PSYCH: Oriented, good eye contact, no obvious depression anxiety, cognition and judgment appear normal. LN: no cervical  axillary inguinal adenopathy  Lab Results  Component Value Date   WBC 4.9 10/17/2017   HGB 13.1 10/17/2017   HCT 38.5 10/17/2017   PLT 171.0 10/17/2017   GLUCOSE 117 (H) 10/17/2017   CHOL 201 (H) 10/17/2017   TRIG 72.0 10/17/2017   HDL 55.30 10/17/2017   LDLDIRECT 144.9 01/26/2012   LDLCALC 132 (H) 10/17/2017   ALT 50 (H) 10/17/2017   AST  47 (H) 10/17/2017   NA 139 10/17/2017   K 4.2 10/17/2017   CL 101 10/17/2017   CREATININE 0.70 10/17/2017   BUN 8 10/17/2017   CO2 33 (H) 10/17/2017   TSH 2.01 10/17/2017   HGBA1C 5.9 04/30/2013    BP Readings from Last 3 Encounters:  10/08/19 (!) 132/78  10/03/19 (!) 130/82  08/21/19 130/82    Lab plan reviewed with patient   ASSESSMENT AND PLAN:  Discussed the following assessment and plan:    ICD-10-CM   1. Visit for preventive health examination  Z00.00 Basic metabolic panel    CBC with Differential/Platelet    Hemoglobin A1c    Hepatic function panel    Lipid panel    TSH  2. Essential hypertension  I10 Basic metabolic panel    CBC with Differential/Platelet    Hemoglobin A1c    Hepatic function panel    Lipid panel    TSH  3. Medication management  Z79.899 Basic metabolic panel    CBC with Differential/Platelet    Hemoglobin A1c    Hepatic function panel    Lipid panel    TSH  4. Hyperlipidemia, unspecified hyperlipidemia type  E78.5 Basic metabolic panel    CBC with Differential/Platelet    Hemoglobin A1c    Hepatic function panel    Lipid panel    TSH  5. Hyperglycemia  R73.9 Basic metabolic panel    CBC with Differential/Platelet    Hemoglobin A1c    Hepatic function panel    Lipid panel    TSH  6. Tobacco use  Z72.0    advise stop   Return in about 1 year (around 10/07/2020) for depending on results, preventive /cpx and medications. NF today  Get lab appt   Please get us ok to send for colo guard and check with insurance   Unable to get through when called  Stop tobacco  Can refill  medication Consider i trying different statin  Patient Care Team: Amyriah Buras, Neta MendsWanda K, MD as PCP - General Milagros EvenerKaur, Rupinder, MD as Consulting Physician (Psychiatry) Patient Instructions  Let us know if we cna order colo guard for colon screening .  Stop tobacco . Continue lifestyle intervention healthy eating and exercise .  Get appt for fasting labs   Orders are in record .   Health Maintenance, Female Adopting a healthy lifestyle and getting preventive care are important in promoting health and wellness. Ask your health care provider about:  The right schedule for you to have regular tests and exams.  Things you can do on your own to prevent diseases and keep yourself healthy. What should I know about diet, weight, and exercise? Eat a healthy diet   Eat a diet that includes plenty of vegetables, fruits, low-fat dairy products, and lean protein.  Do not eat a lot of foods that are high in solid fats, added sugars, or sodium. Maintain a healthy weight Body mass index (BMI) is used to identify weight problems. It estimates body fat based on height and weight. Your health care provider can help determine your BMI and help you achieve or maintain a healthy weight. Get regular exercise Get regular exercise. This is one of the most important things you can do for your health. Most adults should:  Exercise for at least 150 minutes each week. The exercise should increase your heart rate and make you sweat (moderate-intensity exercise).  Do strengthening exercises at least twice a week. This is in addition to the  moderate-intensity exercise.  Spend less time sitting. Even light physical activity can be beneficial. Watch cholesterol and blood lipids Have your blood tested for lipids and cholesterol at 62 years of age, then have this test every 5 years. Have your cholesterol levels checked more often if:  Your lipid or cholesterol levels are high.  You are older than 62 years of age.  You  are at high risk for heart disease. What should I know about cancer screening? Depending on your health history and family history, you may need to have cancer screening at various ages. This may include screening for:  Breast cancer.  Cervical cancer.  Colorectal cancer.  Skin cancer.  Lung cancer. What should I know about heart disease, diabetes, and high blood pressure? Blood pressure and heart disease  High blood pressure causes heart disease and increases the risk of stroke. This is more likely to develop in people who have high blood pressure readings, are of African descent, or are overweight.  Have your blood pressure checked: ? Every 3-5 years if you are 28-39 years of age. ? Every year if you are 3 years old or older. Diabetes Have regular diabetes screenings. This checks your fasting blood sugar level. Have the screening done:  Once every three years after age 33 if you are at a normal weight and have a low risk for diabetes.  More often and at a younger age if you are overweight or have a high risk for diabetes. What should I know about preventing infection? Hepatitis B If you have a higher risk for hepatitis B, you should be screened for this virus. Talk with your health care provider to find out if you are at risk for hepatitis B infection. Hepatitis C Testing is recommended for:  Everyone born from [birth year] through 1963.  Anyone with known risk factors for hepatitis C. Sexually transmitted infections (STIs)  Get screened for STIs, including gonorrhea and chlamydia, if: ? You are sexually active and are younger than 62 years of age. ? You are older than 62 years of age and your health care provider tells you that you are at risk for this type of infection. ? Your sexual activity has changed since you were last screened, and you are at increased risk for chlamydia or gonorrhea. Ask your health care provider if you are at risk.  Ask your health care provider about  whether you are at high risk for HIV. Your health care provider may recommend a prescription medicine to help prevent HIV infection. If you choose to take medicine to prevent HIV, you should first get tested for HIV. You should then be tested every 3 months for as long as you are taking the medicine. Pregnancy  If you are about to stop having your period (premenopausal) and you may become pregnant, seek counseling before you get pregnant.  Take 400 to 800 micrograms (mcg) of folic acid every day if you become pregnant.  Ask for birth control (contraception) if you want to prevent pregnancy. Osteoporosis and menopause Osteoporosis is a disease in which the bones lose minerals and strength with aging. This can result in bone fractures. If you are 27 years old or older, or if you are at risk for osteoporosis and fractures, ask your health care provider if you should:  Be screened for bone loss.  Take a calcium or vitamin D supplement to lower your risk of fractures.  Be given hormone replacement therapy (HRT) to treat symptoms of menopause. Follow  these instructions at home: Lifestyle  Do not use any products that contain nicotine or tobacco, such as cigarettes, e-cigarettes, and chewing tobacco. If you need help quitting, ask your health care provider.  Do not use street drugs.  Do not share needles.  Ask your health care provider for help if you need support or information about quitting drugs. Alcohol use  Do not drink alcohol if: ? Your health care provider tells you not to drink. ? You are pregnant, may be pregnant, or are planning to become pregnant.  If you drink alcohol: ? Limit how much you use to 0-1 drink a day. ? Limit intake if you are breastfeeding.  Be aware of how much alcohol is in your drink. In the [place] one drink equals one 12 oz bottle of beer (355 mL), one 5 oz glass of wine (148 mL), or one 1 oz glass of hard liquor (44 mL). General instructions  Schedule  regular health, dental, and eye exams.  Stay current with your vaccines.  Tell your health care provider if: ? You often feel depressed. ? You have ever been abused or do not feel safe at home. Summary  Adopting a healthy lifestyle and getting preventive care are important in promoting health and wellness.  Follow your health care provider's instructions about healthy diet, exercising, and getting tested or screened for diseases.  Follow your health care provider's instructions on monitoring your cholesterol and blood pressure. This information is not intended to replace advice given to you by your health care provider. Make sure you discuss any questions you have with your health care provider. Document Revised: 02/20/2018 Document Reviewed: 02/20/2018 Elsevier Patient Education  2020 [employer].    [place] K. Kariyah Baugh M.D.

## 2019-10-18 ENCOUNTER — Other Ambulatory Visit: Payer: Self-pay | Admitting: Internal Medicine

## 2019-10-23 ENCOUNTER — Other Ambulatory Visit: Payer: 59

## 2020-03-16 ENCOUNTER — Other Ambulatory Visit: Payer: Self-pay | Admitting: Internal Medicine

## 2020-03-19 ENCOUNTER — Encounter: Payer: Self-pay | Admitting: Family Medicine

## 2020-03-19 ENCOUNTER — Ambulatory Visit: Payer: 59 | Admitting: Family Medicine

## 2020-03-19 ENCOUNTER — Other Ambulatory Visit: Payer: Self-pay

## 2020-03-19 ENCOUNTER — Ambulatory Visit (INDEPENDENT_AMBULATORY_CARE_PROVIDER_SITE_OTHER): Payer: 59

## 2020-03-19 VITALS — BP 138/80 | HR 85 | Ht 62.75 in | Wt 150.0 lb

## 2020-03-19 DIAGNOSIS — M79671 Pain in right foot: Secondary | ICD-10-CM

## 2020-03-19 DIAGNOSIS — S90851A Superficial foreign body, right foot, initial encounter: Secondary | ICD-10-CM

## 2020-03-19 MED ORDER — DOXYCYCLINE HYCLATE 100 MG PO TABS
100.0000 mg | ORAL_TABLET | Freq: Two times a day (BID) | ORAL | 0 refills | Status: DC
Start: 2020-03-19 — End: 2021-05-04

## 2020-03-19 NOTE — Progress Notes (Signed)
Established Patient Office Visit  Subjective:  Patient ID: Amanda [name], female    DOB: 1955-11-14  Age: 65 y.o. MRN: [id number]  CC:  Chief Complaint  Patient presents with  . Foot Injury    HPI Amanda [name] presents for right foot pain.  About 1 week ago she was walking at home in her house and stepped on something metallic.  She was able to remove a small sliver of something at the surface but feels like there is a piece that went up inside her foot.  She thinks this may be one of the small pieces of metal that are used to hang Christmas ornaments.  She had some soreness with ambulation since then.  Past Medical History:  Diagnosis Date  . Endometriosis    hysterectomy nad right oopherectomy  . H/O sciatica    left  . Infectious mononucleosis hepatitis   . Narcotic dependence, in remission [facility])     Past Surgical History:  Procedure Laterality Date  . ABDOMINAL HYSTERECTOMY    . BUNIONECTOMY     Both feet 25 years ago  . CHOLECYSTECTOMY    . TOTAL VAGINAL HYSTERECTOMY  2002    Family History  Problem Relation Age of Onset  . Diabetes Mother   . Heart disease Mother 40       heart attack in 59s   . Hyperlipidemia Mother   . Hypertension Mother   . Hypertension Father   . Kidney disease Father   . Vascular Disease Father   . Diabetes Brother   . Crohn's disease Brother   . Heart disease Maternal Grandmother 22       MI age 38    Social History   Socioeconomic History  . Marital status: Married    Spouse name: Not on file  . Number of children: Not on file  . Years of education: Not on file  . Highest education level: Not on file  Occupational History  . Not on file  Tobacco Use  . Smoking status: Current Every Day Smoker  . Smokeless tobacco: Never Used  . Tobacco comment: 3 cigs daily as of 10/17/17  Vaping Use  . Vaping Use: Never used  Substance and Sexual Activity  . Alcohol use: Yes  . Drug use: Never  . Sexual activity: Not on file   Other Topics Concern  . Not on file  Social History Narrative   hh of 3 married     Tobacco    No etoh   3-4 caffiene.   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 lisinopril-hydrochlorothiazide (ZESTORETIC) 10-12.5 MG tablet TAKE 1 TABLET BY MOUTH DAILY 90 tablet 1  . ZUBSOLV 5.7-1.4 MG SUBL      No facility-administered medications prior to visit.    Allergies  Allergen Reactions  . Aleve [Naproxen Sodium] Other (See Comments)    Racing heart  Can take ibuprofen  . Atorvastatin     REACTION: myalgias  . Penicillins Rash    ROS Review of Systems  Constitutional: Negative  for chills and fever.      Objective:    Physical Exam Vitals reviewed.  Constitutional:      Appearance: Normal appearance.  Cardiovascular:     Rate and Rhythm: Normal rate and regular rhythm.  Skin:    Comments: Right foot on the heal of the foot reveals area of tenderness approximately 2 x 2 cm.  Near the center there is a very small punctate area of the skin that looks like probably entry point from recent foreign body.  No erythema.  No pustules.  No drainage.  Not warm to touch.  Neurological:     Mental Status: She is alert.     BP 138/80   Pulse 85   Ht 5' 2.75" (1.594 m)   Wt 150 lb (68 kg)   SpO2 99%   BMI 26.78 kg/m  Wt Readings from Last 3 Encounters:  03/19/20 150 lb (68 kg)  10/08/19 151 lb (68.5 kg)  10/03/19 149 lb (67.6 kg)     Health Maintenance Due  Topic Date Due  . Hepatitis C Screening  Never done  . HIV Screening  Never done  . COLONOSCOPY (Pts 45-10yrs Insurance coverage will  need to be confirmed)  Never done  . MAMMOGRAM  Never done  . TETANUS/TDAP  03/13/2017  . INFLUENZA VACCINE  10/12/2019  . COVID-19 Vaccine (3 - Booster for Pfizer series) 12/16/2019    There are no preventive care reminders to display for this patient.  Lab Results  Component Value Date   TSH 2.01 10/17/2017   Lab Results  Component Value Date   WBC 4.9 10/17/2017   HGB 13.1 10/17/2017   HCT 38.5 10/17/2017   MCV 90.2 10/17/2017   PLT 171.0 10/17/2017   Lab Results  Component Value Date   NA 139 10/17/2017   K 4.2 10/17/2017   CO2 33 (H) 10/17/2017   GLUCOSE 117 (H) 10/17/2017   BUN 8 10/17/2017   CREATININE 0.70 10/17/2017   BILITOT 1.0 10/17/2017   ALKPHOS 109 10/17/2017   AST 47 (H) 10/17/2017   ALT 50 (H) 10/17/2017   PROT 6.8 10/17/2017   ALBUMIN 4.6 10/17/2017   CALCIUM 9.7 10/17/2017   GFR 90.58 10/17/2017   Lab Results  Component Value Date   CHOL 201 (H) 10/17/2017   Lab Results  Component Value Date   HDL 55.30 10/17/2017   Lab Results  Component Value Date   LDLCALC 132 (H) 10/17/2017   Lab Results  Component Value Date   TRIG 72.0 10/17/2017   Lab Results  Component Value Date   CHOLHDL 4 10/17/2017   Lab Results  Component Value Date   HGBA1C 5.9 04/30/2013      Assessment & Plan:   Problem List Items Addressed This Visit   None   Visit Diagnoses    Right foot pain    -  Primary   Relevant Orders   DG Foot 2 Views Right    Patient has suspected foreign body right foot from history.  We obtained x-ray which did confirm metallic foreign body in the heel.  We discussed the fact this would need to be removed.  We offered to numb her foot and see if we could grasp the metallic foreign body.  We discussed risk of bleeding and low risk of infection.  Patient consented.  Foot prepped with Betadine.  Local anesthesia with 1% plain Xylocaine.  Made a small linear incision with #11 blade.  We used some  straight hemostats but  unable to  grasp metallic foreign body.  Minimal bleeding.  Topical antibiotic and dressing applied. -We will schedule follow-up with podiatrist early next week. -Recommend she elevate foot frequently over the weekend -Start doxycycline 100 mg twice daily for 7 days  Meds ordered this encounter  Medications  . doxycycline (VIBRA-TABS) 100 MG tablet    Sig: Take 1 tablet (100 mg total) by mouth 2 (two) times daily.    Dispense:  14 tablet    Refill:  0    Follow-up: No follow-ups on file.    Evelena Peat, MD

## 2020-03-19 NOTE — Patient Instructions (Signed)
We will call podiatry first thing Monday and try to get you in as soon possible  Elevate leg frequently.

## 2020-03-22 ENCOUNTER — Other Ambulatory Visit: Payer: Self-pay

## 2020-03-22 ENCOUNTER — Ambulatory Visit: Payer: Self-pay

## 2020-03-22 ENCOUNTER — Telehealth: Payer: Self-pay

## 2020-03-22 ENCOUNTER — Ambulatory Visit: Payer: 59 | Admitting: Podiatry

## 2020-03-22 DIAGNOSIS — S90851A Superficial foreign body, right foot, initial encounter: Secondary | ICD-10-CM | POA: Diagnosis not present

## 2020-03-22 NOTE — Addendum Note (Signed)
Addended by: Kristian Covey on: 03/22/2020 08:04 AM   Modules accepted: Orders

## 2020-03-22 NOTE — Patient Instructions (Signed)

## 2020-03-22 NOTE — Progress Notes (Signed)
Pt aware of metallic foreign body in heel.   We reviewed x-ray in office.  We are setting up podiatry referral.

## 2020-03-22 NOTE — Telephone Encounter (Signed)
DOS 03/23/2020  EXC FOREIGN BODY RT - 28192  [employer] MEDICARE EFFECTIVE DATE - 03/13/2020  PLAN DEDUCTIBLE - $500.00 W/ $500.00 REMAINING OUT OF POCKET - $3000.00 W/ $3000.00 REMAINING COPAY $0.00 COINSURANCE - 20%  Notification or Prior Authorization is not required for the requested services  This [employer] plan does not currently require a prior authorization for these services. If you have general questions about the prior authorization requirements, please call us at [phone] or visit [email or web address] > Clinician Resources > Advance and Admission Notification Requirements. The number above acknowledges your notification. Please write this number down for future reference. Notification is not a guarantee of coverage or payment.  Decision ID #:[id number]

## 2020-03-23 ENCOUNTER — Other Ambulatory Visit: Payer: Self-pay | Admitting: Podiatry

## 2020-03-23 DIAGNOSIS — S90851A Superficial foreign body, right foot, initial encounter: Secondary | ICD-10-CM | POA: Diagnosis not present

## 2020-03-23 MED ORDER — ONDANSETRON HCL 4 MG PO TABS: 4.0000 mg | ORAL_TABLET | Freq: Three times a day (TID) | ORAL | 0 refills | Status: DC | PRN

## 2020-03-23 MED ORDER — IBUPROFEN 800 MG PO TABS: 800.0000 mg | ORAL_TABLET | Freq: Three times a day (TID) | ORAL | 0 refills | Status: AC | PRN

## 2020-03-23 MED ORDER — SULFAMETHOXAZOLE-TRIMETHOPRIM 800-160 MG PO TABS: 1.0000 | ORAL_TABLET | Freq: Two times a day (BID) | ORAL | 0 refills | Status: DC

## 2020-03-23 NOTE — Progress Notes (Signed)
Postop medications sent 

## 2020-03-24 NOTE — Progress Notes (Signed)
Subjective:   Patient ID: Amanda [name], female   DOB: 65 y.o.   MRN: [id number]   HPI 65 year old female presents the office today for concerns of a piece of metal in her right foot.  She said that this happened about 1 week ago but she is not sure exactly where she stepped on.  She saw her primary care physician and they try to move it was unsuccessful.  The area is very tender.  She is currently on doxycycline.   Review of Systems  All other systems reviewed and are negative.  Past Medical History:  Diagnosis Date  . Endometriosis    hysterectomy nad right oopherectomy  . H/O sciatica    left  . Infectious mononucleosis hepatitis   . Narcotic dependence, in remission [facility])     Past Surgical History:  Procedure Laterality Date  . ABDOMINAL HYSTERECTOMY    . BUNIONECTOMY     Both feet 25 years ago  . CHOLECYSTECTOMY    . TOTAL VAGINAL HYSTERECTOMY  2002     Current Outpatient Medications:  .  doxycycline (VIBRA-TABS) 100 MG tablet, Take 1 tablet (100 mg total) by mouth 2 (two) times daily., Disp: 14 tablet, Rfl: 0 .  ibuprofen (ADVIL) 800 MG tablet, Take 1 tablet (800 mg total) by mouth every 8 (eight) hours as needed., Disp: 30 tablet, Rfl: 0 .  lisinopril-hydrochlorothiazide (ZESTORETIC) 10-12.5 MG tablet, TAKE 1 TABLET BY MOUTH DAILY, Disp: 90 tablet, Rfl: 1 .  ondansetron (ZOFRAN) 4 MG tablet, Take 1 tablet (4 mg total) by mouth every 8 (eight) hours as needed for nausea or vomiting., Disp: 20 tablet, Rfl: 0 .  sulfamethoxazole-trimethoprim (BACTRIM DS) 800-160 MG tablet, Take 1 tablet by mouth 2 (two) times daily., Disp: 14 tablet, Rfl: 0 .  ZUBSOLV 5.7-1.4 MG SUBL, , Disp: , Rfl:   Allergies  Allergen Reactions  . Aleve [Naproxen Sodium] Other (See Comments)    Racing heart  Can take ibuprofen  . Atorvastatin     REACTION: myalgias  . Penicillins Rash         Objective:  Physical Exam  General: AAO x3, NAD-presents with husband  Dermatological: Puncture  wound present on the plantar aspect of the right foot with localized edema.  No erythema or warmth.  There is mild swelling present.  There is no fluctuation, crepitation, malodor.  Vascular: Dorsalis Pedis artery and Posterior Tibial artery pedal pulses are 2/4 bilateral with immedate capillary fill time.  There is no pain with calf compression, swelling, warmth, erythema.   Neruologic: Grossly intact via light touch bilateral.   Musculoskeletal: Tenderness along the area of puncture wound right foot.  Muscular strength 5/5 in all groups tested bilateral.  Gait: Unassisted, Nonantalgic.       Assessment:   Right foot retained foreign body     Plan:  -Treatment options discussed including all alternatives, risks, and complications -Etiology of symptoms were discussed -Previous x-rays were reviewed.  Metallic foreign body present plantar calcaneus.  Discussed with her removal.  Given the depth of this I recommended this in the operating room to remove the foreign body, I&D.  At this point she wishes to proceed we will plan on scheduling this tomorrow afternoon to remove this. -The incision placement as well as the postoperative course was discussed with the patient. I discussed risks of the surgery which include, but not limited to, infection, bleeding, pain, swelling, need for further surgery, delayed or nonhealing, painful or ugly scar, numbness or  sensation changes, over/under correction, recurrence, transfer lesions, further deformity,  DVT/PE, loss of toe/foot. Patient understands these risks and wishes to proceed with surgery. The surgical consent was reviewed with the patient all 3 pages were signed. No promises or guarantees were given to the outcome of the procedure. All questions were answered to the best of my ability. Before the surgery the patient was encouraged to call the office if there is any further questions. The surgery will be performed at the [facility] on an outpatient  basis. -Surgical shoe dispensed for postoperative use  Vivi Barrack DPM

## 2020-03-25 ENCOUNTER — Ambulatory Visit: Payer: Self-pay | Admitting: Podiatry

## 2020-03-29 ENCOUNTER — Encounter: Payer: 59 | Admitting: Podiatry

## 2020-04-01 ENCOUNTER — Telehealth: Payer: Self-pay | Admitting: Podiatry

## 2020-04-01 ENCOUNTER — Encounter: Payer: 59 | Admitting: Podiatry

## 2020-04-01 ENCOUNTER — Other Ambulatory Visit: Payer: 59

## 2020-04-01 NOTE — Telephone Encounter (Signed)
Patient has tested positive yesterday, still has stitches that were scheduled to be removed today (per patient) and wants to know how to proceed with care, Please Advise

## 2020-04-02 NOTE — Telephone Encounter (Signed)
Amanda [name] spoke with her yesterday. She is going to come in next week for suture removal. If she still has any symptoms to let us know prior to coming in.   She can change the bandage and reapply a small amount of antibiotic ointment and dressing. Watch for any signs/symptoms of infection.

## 2020-04-06 ENCOUNTER — Other Ambulatory Visit: Payer: Self-pay

## 2020-04-06 ENCOUNTER — Ambulatory Visit (INDEPENDENT_AMBULATORY_CARE_PROVIDER_SITE_OTHER): Payer: 59 | Admitting: Podiatry

## 2020-04-06 DIAGNOSIS — S90851A Superficial foreign body, right foot, initial encounter: Secondary | ICD-10-CM

## 2020-04-08 ENCOUNTER — Encounter: Payer: 59 | Admitting: Podiatry

## 2020-04-11 NOTE — Progress Notes (Signed)
Subjective: Amanda [name] is a 65 y.o. is seen today in office s/p right foot foreign body removal performed on 12/21/2020.  She had missed her last appointment because she tested positive for COVID.  She has been changing the bandage and she has not seen any swelling or redness or any drainage and overall no significant pain. Denies any systemic complaints such as fevers, chills, nausea, vomiting. No calf pain, chest pain, shortness of breath.   Objective: General: No acute distress, AAOx3  DP/PT pulses palpable 2/4, CRT < 3 sec to all digits.  Protective sensation intact. Motor function intact.  Right foot: Incision is well coapted without any evidence of dehiscence with 2 single sutures in place.  There is no surrounding erythema, drainage or pus or ascending cellulitis.  No edema.  No significant pain to palpation to the area.  No other areas of tenderness to bilateral lower extremities.  No other open lesions or pre-ulcerative lesions.  No pain with calf compression, swelling, warmth, erythema.   Assessment and Plan:  Status post I&D, foreign body removal, doing well with no complications   -Treatment options discussed including all alternatives, risks, and complications -Sutures removed without complications.  No obvious signs of infection today there is no significant pain.  Discussed antibiotic ointment dressing changes daily.  Continue offloading.  Monitor for any signs or symptoms of infection  Return in about 2 weeks (around 04/20/2020).  Vivi Barrack DPM

## 2020-04-20 ENCOUNTER — Encounter: Payer: 59 | Admitting: Podiatry

## 2020-11-02 ENCOUNTER — Other Ambulatory Visit: Payer: Self-pay

## 2020-11-02 MED ORDER — LISINOPRIL-HYDROCHLOROTHIAZIDE 10-12.5 MG PO TABS: 1.0000 | ORAL_TABLET | Freq: Every day | ORAL | 0 refills | Status: DC

## 2020-11-26 ENCOUNTER — Other Ambulatory Visit: Payer: Self-pay | Admitting: Internal Medicine

## 2020-12-24 ENCOUNTER — Other Ambulatory Visit: Payer: Self-pay | Admitting: Internal Medicine

## 2021-01-27 ENCOUNTER — Other Ambulatory Visit: Payer: Self-pay | Admitting: Internal Medicine

## 2021-02-28 ENCOUNTER — Telehealth: Payer: Self-pay | Admitting: Internal Medicine

## 2021-02-28 NOTE — Telephone Encounter (Signed)
Patient was informed that she had to schedule a physical in order to get her blood pressure medication. Patient was scheduled for 01/18 for her physical.  Patient is requesting for a supply to be sent in to her pharmacy until her physical because she is out.  Patient could be contacted at [phone].  Please advise.

## 2021-03-01 ENCOUNTER — Other Ambulatory Visit: Payer: Self-pay

## 2021-03-01 MED ORDER — LISINOPRIL-HYDROCHLOROTHIAZIDE 10-12.5 MG PO TABS: 1.0000 | ORAL_TABLET | Freq: Every day | ORAL | 0 refills | Status: DC

## 2021-03-01 NOTE — Telephone Encounter (Signed)
Rx sent to the pharmacy.

## 2021-03-30 ENCOUNTER — Other Ambulatory Visit: Payer: Self-pay

## 2021-03-30 ENCOUNTER — Other Ambulatory Visit: Payer: Self-pay | Admitting: Internal Medicine

## 2021-03-30 ENCOUNTER — Encounter: Payer: 59 | Admitting: Internal Medicine

## 2021-03-30 ENCOUNTER — Telehealth: Payer: Self-pay | Admitting: Internal Medicine

## 2021-03-30 MED ORDER — LISINOPRIL-HYDROCHLOROTHIAZIDE 10-12.5 MG PO TABS: 1.0000 | ORAL_TABLET | Freq: Every day | ORAL | 0 refills | Status: DC

## 2021-03-30 NOTE — Telephone Encounter (Signed)
Patient called because she is not feeling well and she has rescheduled her CPE to 02/08. Patient would like some lisinopril-hydrochlorothiazide (ZESTORETIC) 10-12.5 MG tablet to hold her over until her physical appointment.      Please send to   [facility], [place] Phone:  [phone]  Fax:  [phone]         Please advise

## 2021-03-30 NOTE — Telephone Encounter (Signed)
Rx sent to the pharmacy.

## 2021-04-18 ENCOUNTER — Encounter: Payer: 59 | Admitting: Internal Medicine

## 2021-04-29 ENCOUNTER — Other Ambulatory Visit: Payer: Self-pay | Admitting: Internal Medicine

## 2021-05-03 NOTE — Progress Notes (Signed)
Chief Complaint  Patient presents with   Annual Exam    Not fasting 90 day supply on Rx    HPI: Patient  Amanda [name]  66 y.o. comes in today for Preventive Health Care visit   Doing pretty well taking antihypertensive medication generally good. Still sees specialist on Zubsolv doing well no other medicines.    Health Maintenance  Topic Date Due   INFLUENZA VACCINE  11/01/2021 (Originally 10/11/2020)   MAMMOGRAM  11/01/2021 (Originally 05/01/2007)   COLONOSCOPY (Pts 45-38yrs Insurance coverage will need to be confirmed)  11/01/2021 (Originally 04/30/2002)   COVID-19 Vaccine (3 - Pfizer risk series) 11/01/2021 (Originally 07/14/2019)   Hepatitis C Screening  11/01/2021 (Originally 05/01/1975)   HIV Screening  11/01/2021 (Originally 04/30/1972)   Zoster Vaccines- Shingrix (1 of 2) 11/01/2021 (Originally 04/30/1976)   TETANUS/TDAP  05/05/2031   HPV VACCINES  Aged Out   Health Maintenance Review LIFESTYLE:  Exercise:  runs around care of toddler etc  Tobacco/ETS: 3 per day .  Alcohol:  no Sugar beverages: no diet or gatorade   Sleep:  hard to stay asleep.   Amanda [name] activity.     18 mos   Drug use: no HH of   3   no pets  outside dgo   ROS:  REST of 12 system review negative except as per HPI   Past Medical History:  Diagnosis Date   Endometriosis    hysterectomy nad right oopherectomy   H/O sciatica    left   Infectious mononucleosis hepatitis    Narcotic dependence, in remission [facility])     Past Surgical History:  Procedure Laterality Date   ABDOMINAL HYSTERECTOMY     BUNIONECTOMY     Both feet 25 years ago   [place]  2002  Brother had some type of carotid artery  procedure for  ? Stenosis s ? He is older than her  Family History  Problem Relation Age of Onset   Diabetes Mother    Heart disease Mother 96       heart attack in 2s    Hyperlipidemia Mother    Hypertension Mother    Hypertension Father    Kidney disease  Father    Vascular Disease Father    Diabetes Brother    Crohn's disease Brother    Heart disease Maternal Grandmother 51       MI age 61    Social History   Socioeconomic History   Marital status: Married    Spouse name: Not on file   Number of children: Not on file   Years of education: Not on file   Highest education level: Not on file  Occupational History   Not on file  Tobacco Use   Smoking status: Every Day   Smokeless tobacco: Never   Tobacco comments:    3 cigs daily as of 10/17/17  Vaping Use   Vaping Use: Never used  Substance and Sexual Activity   Alcohol use: Yes   Drug use: Never   Sexual activity: Not on file  Other Topics Concern   Not on file  Social History Narrative   hh of 3 married     Tobacco    No etoh   3-4 caffiene.   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 30 tablet 0   lisinopril-hydrochlorothiazide (ZESTORETIC) 10-12.5 MG tablet TAKE 1 TABLET BY MOUTH DAILY 30 tablet 0   ZUBSOLV 5.7-1.4 MG SUBL      doxycycline (VIBRA-TABS) 100 MG tablet Take 1 tablet (100 mg total) by mouth 2 (two) times daily. 14 tablet 0   ondansetron (ZOFRAN) 4 MG tablet Take 1 tablet (4 mg total) by mouth every 8 (eight) hours as needed for nausea or vomiting. 20 tablet 0   sulfamethoxazole-trimethoprim (BACTRIM DS) 800-160 MG tablet Take 1 tablet by mouth 2 (two) times daily. 14 tablet 0   No facility-administered medications prior to visit.     EXAM:  BP 136/82 (BP Location: Left Arm, Patient Position: Sitting, Cuff  Size: Normal)    Pulse 91    Temp 98.6 F (37 C) (Oral)    Ht 5' 2.75" (1.594 m)    Wt 156 lb 3.2 oz (70.9 kg)    SpO2 99%    BMI 27.89 kg/m   Body mass index is 27.89 kg/m. Wt Readings from Last 3 Encounters:  05/04/21 156 lb 3.2 oz (70.9 kg)  03/19/20 150 lb (68 kg)  10/08/19 151 lb (68.5 kg)    Physical Exam: Vital signs reviewed VQQ:VZDG is a well-developed well-nourished alert cooperative    who appearsr stated age in no acute distress.  HEENT: normocephalic atraumatic , Eyes: PERRL EOM's full, conjunctiva clear, Nares: paten,t no deformity discharge or tenderness., Ears: no deformity EAC's clear TMs with normal landmarks. Mouth: masked NECK: supple without masses, thyromegaly or bruits. CHEST/PULM:  Clear to auscultation and percussion breath sounds equal no wheeze , rales or rhonchi. No chest wall deformities or tenderness. Breast: normal by inspection . No dimpling, discharge, masses, tenderness or discharge . CV: PMI is nondisplaced, S1 S2 no gallops, murmurs, rubs. Peripheral pulses are full without delay.No JVD .  ABDOMEN: Bowel sounds normal nontender  No guard or rebound, no hepato splenomegal no CVA tenderness.   Extremtities:  No clubbing cyanosis or edema, no acute joint swelling or redness no focal atrophy NEURO:  Oriented x3, cranial nerves 3-12 appear to be intact, no obvious focal weakness,gait within normal limits no abnormal reflexes or asymmetrical SKIN: No acute rashes normal turgor, color, no bruising or petechiae. PSYCH: Oriented, good eye contact, no obvious depression anxiety, cognition and judgment appear normal. LN: no cervical axillary inguinal adenopathy  Lab Results  Component Value Date   WBC 4.9 10/17/2017   HGB 13.1 10/17/2017   HCT 38.5 10/17/2017   PLT 171.0 10/17/2017   GLUCOSE 117 (H) 10/17/2017   CHOL 201 (H) 10/17/2017   TRIG 72.0 10/17/2017   HDL 55.30 10/17/2017   LDLDIRECT 144.9 01/26/2012   LDLCALC 132 (H) 10/17/2017   ALT 50 (H)  10/17/2017   AST 47 (H) 10/17/2017   NA 139 10/17/2017   K 4.2 10/17/2017   CL 101 10/17/2017   CREATININE 0.70 10/17/2017   BUN 8 10/17/2017   CO2 33 (H) 10/17/2017   TSH 2.01 10/17/2017   HGBA1C 5.9 04/30/2013    BP Readings from Last 3 Encounters:  05/04/21 136/82  03/19/20 138/80  10/08/19 Marland Kitchen)  132/78    Lab plan reviewed with patient  will come back fasting  options  ASSESSMENT AND PLAN:  Discussed the following assessment and plan:    ICD-10-CM   1. Visit for preventive health examination  123456 Basic metabolic panel    CBC with Differential/Platelet    Hemoglobin A1c    Hepatic function panel    Lipid panel    TSH    Tdap vaccine greater than or equal to 7yo IM    2. Screen for colon cancer  Z12.11 Cologuard    3. Encounter for screening mammogram for malignant neoplasm of breast  Z12.31 MM Digital Screening    Basic metabolic panel    CBC with Differential/Platelet    Hemoglobin A1c    Hepatic function panel    Lipid panel    TSH    4. Essential hypertension  99991111 Basic metabolic panel    CBC with Differential/Platelet    Hemoglobin A1c    Hepatic function panel    Lipid panel    TSH    5. Medication management  123456 Basic metabolic panel    CBC with Differential/Platelet    Hemoglobin A1c    Hepatic function panel    Lipid panel    TSH    6. Hyperlipidemia, unspecified hyperlipidemia type  [id number] Basic metabolic panel    CBC with Differential/Platelet    Hemoglobin A1c    Hepatic function panel    Lipid panel    TSH    7. Hyperglycemia  123456 Basic metabolic panel    CBC with Differential/Platelet    Hemoglobin A1c    Hepatic function panel    Lipid panel    TSH    8. Tobacco use  [id number] Basic metabolic panel    CBC with Differential/Platelet    Hemoglobin A1c    Hepatic function panel    Lipid panel    TSH    Had significant se if lipitor in past hesitant to try another statin but  consider disc ct calcium score   Will await  results  of  FLP Delayed on screenings orders placed for Mammogram  Cologuard. Ok to do 90 days when refill needed  Continue dc tovacco efforts  Reconsider ascvd risk intervention and or other  intervention Return for fasting lab      appt . Marland Kitchen  Patient Care Team: Doyce Stonehouse, Standley Brooking, MD as PCP - General Chucky May, MD as Consulting Physician (Psychiatry) Patient Instructions  Good to see you today .  Of course advise no tobacco for vascular health.  Appt for fasting labs.   Will order cologuard and  mammogram routine.  Continue BP control  Consider a different  cholesterol medication  or  self pay  99 $ coronary calcium score  to assess  level of risk .   Standley Brooking. Ravenna Legore M.D.

## 2021-05-04 ENCOUNTER — Ambulatory Visit (INDEPENDENT_AMBULATORY_CARE_PROVIDER_SITE_OTHER): Payer: 59 | Admitting: Internal Medicine

## 2021-05-04 ENCOUNTER — Encounter: Payer: Self-pay | Admitting: Internal Medicine

## 2021-05-04 VITALS — BP 136/82 | HR 91 | Temp 98.6°F | Ht 62.75 in | Wt 156.2 lb

## 2021-05-04 DIAGNOSIS — Z1231 Encounter for screening mammogram for malignant neoplasm of breast: Secondary | ICD-10-CM

## 2021-05-04 DIAGNOSIS — I10 Essential (primary) hypertension: Secondary | ICD-10-CM

## 2021-05-04 DIAGNOSIS — Z0000 Encounter for general adult medical examination without abnormal findings: Secondary | ICD-10-CM

## 2021-05-04 DIAGNOSIS — Z79899 Other long term (current) drug therapy: Secondary | ICD-10-CM | POA: Diagnosis not present

## 2021-05-04 DIAGNOSIS — Z720 Tobacco use: Secondary | ICD-10-CM

## 2021-05-04 DIAGNOSIS — E785 Hyperlipidemia, unspecified: Secondary | ICD-10-CM | POA: Diagnosis not present

## 2021-05-04 DIAGNOSIS — Z23 Encounter for immunization: Secondary | ICD-10-CM

## 2021-05-04 DIAGNOSIS — R739 Hyperglycemia, unspecified: Secondary | ICD-10-CM

## 2021-05-04 DIAGNOSIS — Z1211 Encounter for screening for malignant neoplasm of colon: Secondary | ICD-10-CM | POA: Diagnosis not present

## 2021-05-04 NOTE — Patient Instructions (Addendum)
Good to see you today .  Of course advise no tobacco for vascular health.  Appt for fasting labs.   Will order cologuard and  mammogram routine.  Continue BP control  Consider a different  cholesterol medication  or  self pay  99 $ coronary calcium score  to assess  level of risk .

## 2021-05-05 ENCOUNTER — Encounter: Payer: Self-pay | Admitting: Internal Medicine

## 2021-05-29 ENCOUNTER — Other Ambulatory Visit: Payer: Self-pay | Admitting: Internal Medicine

## 2021-05-30 ENCOUNTER — Other Ambulatory Visit: Payer: 59

## 2021-06-27 ENCOUNTER — Other Ambulatory Visit: Payer: Self-pay | Admitting: Internal Medicine

## 2022-03-09 ENCOUNTER — Telehealth: Payer: Self-pay

## 2022-03-09 ENCOUNTER — Other Ambulatory Visit: Payer: Self-pay | Admitting: Internal Medicine

## 2022-03-09 NOTE — Telephone Encounter (Signed)
Called pt regarding to past due fasting lab from 04/2021 that needs to be done. Left a voicemail to call us back.

## 2022-05-18 IMAGING — DX DG FOOT 2V*R*
2 series · 2 of 2 positions shown · non-contrast
Comparison: None.

CLINICAL DATA: One week ago stepped on metal. Evaluate foreign body
in right heel.

EXAM:
RIGHT FOOT - 2 VIEW

[foot ap]
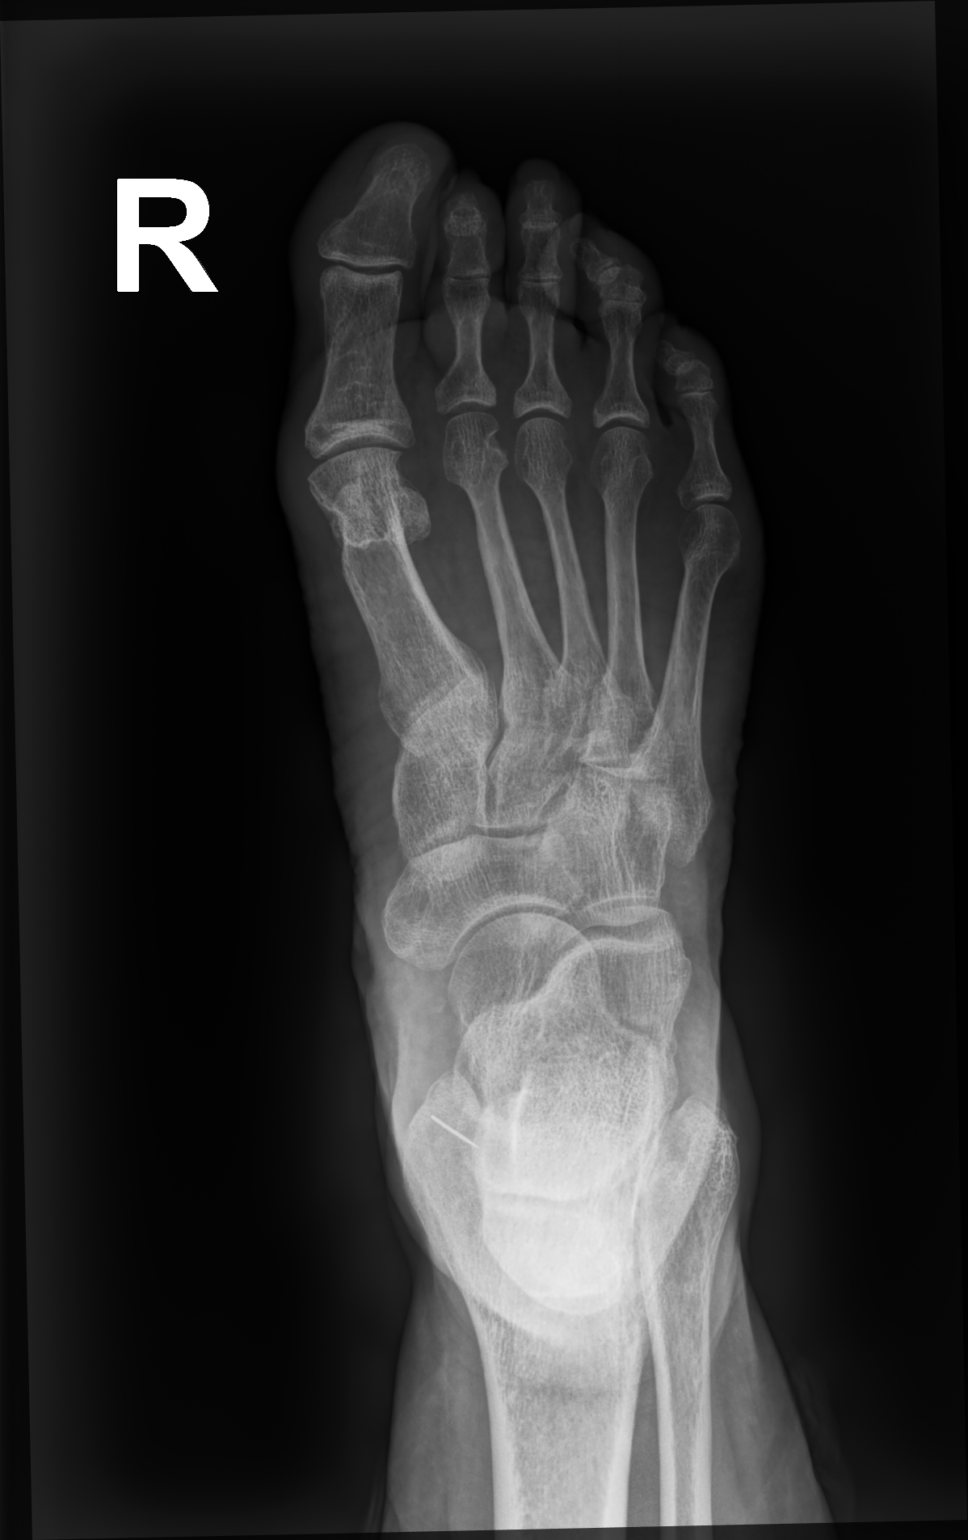

[foot lat]
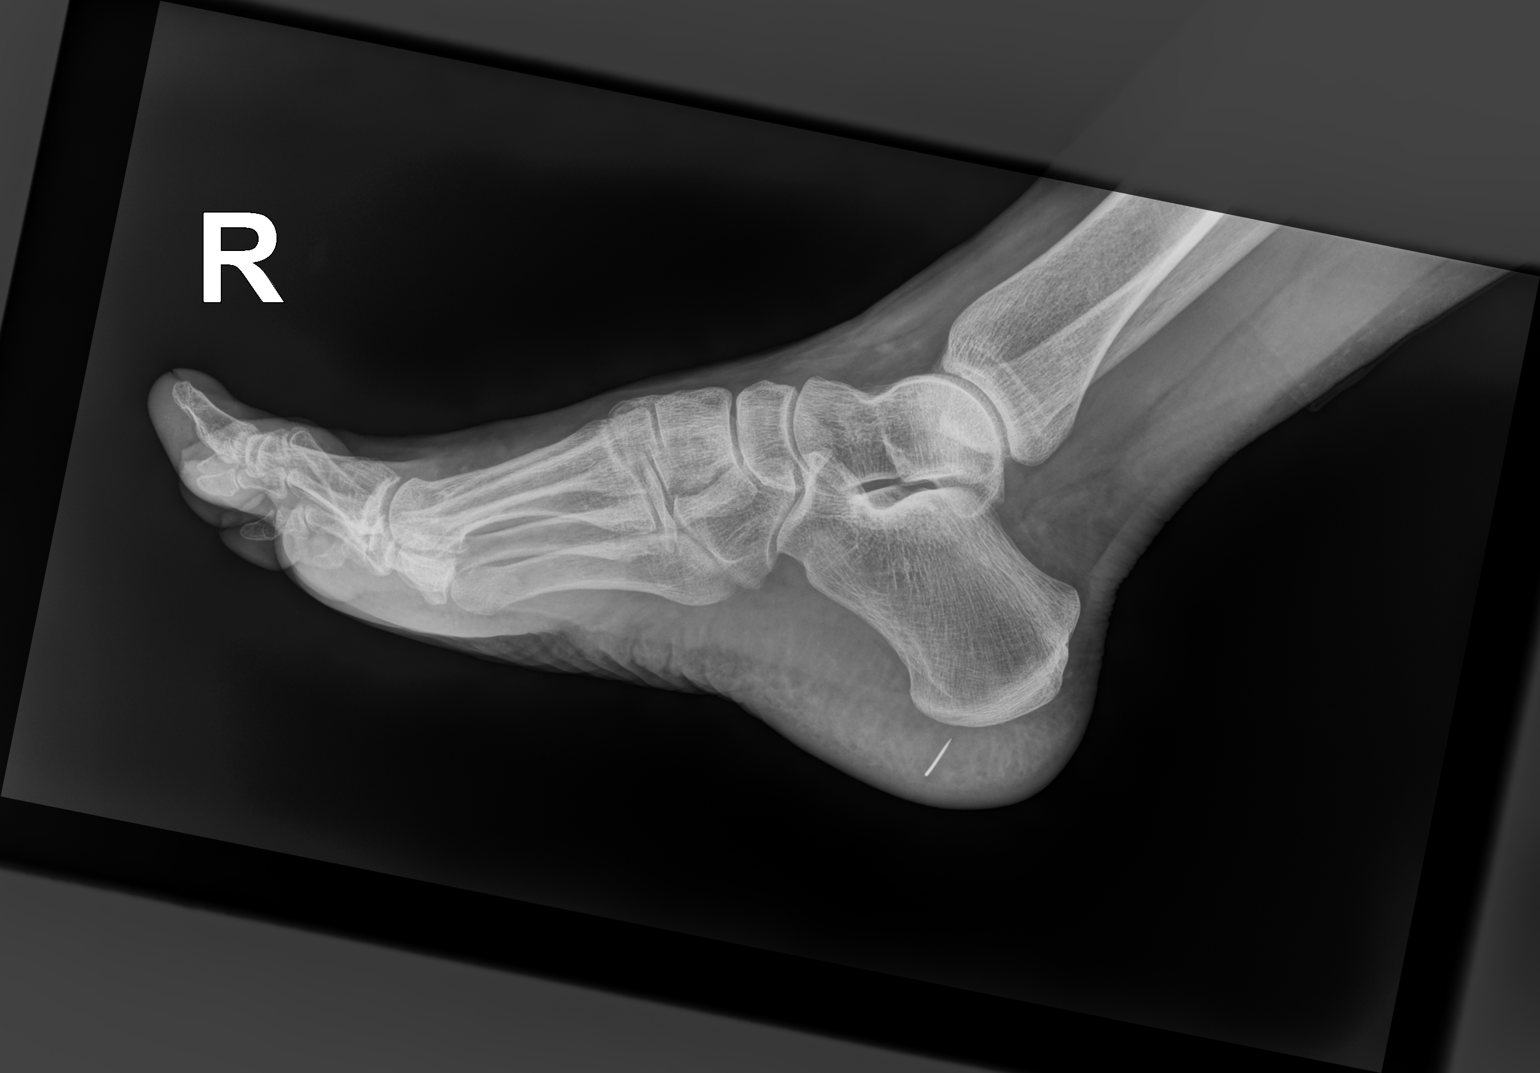

[2 of 2 positions shown; findings below may reference images not displayed]

FINDINGS: A metallic foreign body measuring up to 1 cm in length is oriented
vertically in the heel. This may be the end of the needle. No
surrounding soft tissue gas identified. No bony erosion in the
adjacent calcaneus.
IMPRESSION: 1 cm foreign body in the soft tissues of the heel as above.

## 2022-11-02 ENCOUNTER — Other Ambulatory Visit: Payer: Self-pay | Admitting: Internal Medicine

## 2022-12-15 ENCOUNTER — Other Ambulatory Visit: Payer: Self-pay | Admitting: Internal Medicine

## 2022-12-15 DIAGNOSIS — Z1212 Encounter for screening for malignant neoplasm of rectum: Secondary | ICD-10-CM

## 2022-12-15 DIAGNOSIS — Z1211 Encounter for screening for malignant neoplasm of colon: Secondary | ICD-10-CM

## 2023-03-13 ENCOUNTER — Encounter: Payer: Self-pay | Admitting: Internal Medicine

## 2023-03-20 ENCOUNTER — Encounter: Payer: Self-pay | Admitting: Internal Medicine

## 2023-03-25 ENCOUNTER — Other Ambulatory Visit: Payer: Self-pay | Admitting: Internal Medicine

## 2023-04-17 ENCOUNTER — Encounter: Payer: Self-pay | Admitting: Internal Medicine

## 2023-06-04 DIAGNOSIS — F1120 Opioid dependence, uncomplicated: Secondary | ICD-10-CM | POA: Diagnosis not present

## 2023-06-04 DIAGNOSIS — Z79899 Other long term (current) drug therapy: Secondary | ICD-10-CM | POA: Diagnosis not present

## 2023-06-26 ENCOUNTER — Other Ambulatory Visit: Payer: Self-pay | Admitting: Family

## 2023-07-03 ENCOUNTER — Other Ambulatory Visit: Payer: Self-pay | Admitting: Internal Medicine

## 2023-07-05 ENCOUNTER — Other Ambulatory Visit: Payer: Self-pay | Admitting: Internal Medicine

## 2023-07-05 NOTE — Telephone Encounter (Signed)
 Copied from CRM [id number]. Topic: Clinical - Medication Refill >> Jul 05, 2023  7:52 AM Juluis Ok wrote: Most Recent Primary Care Visit:  Provider: Daphine Eagle K  Department: [facility]  Visit Type: PHYSICAL  Date: 05/04/2021  Medication: lisinopril -hydrochlorothiazide  (ZESTORETIC ) 10-12.5 MG tablet  Has the patient contacted their pharmacy? Yes (Agent: If no, request that the patient contact the pharmacy for the refill. If patient does not wish to contact the pharmacy document the reason why and proceed with request.) (Agent: If yes, when and what did the pharmacy advise?)  Is this the correct pharmacy for this prescription? Yes If no, delete pharmacy and type the correct one.  This is the patient's preferred pharmacy:  [facility] [address]  [place], [place] [address] [phone]   Has the prescription been filled recently? No  Is the patient out of the medication? Yes  Has the patient been seen for an appointment in the last year OR does the patient have an upcoming appointment? No  Can we respond through MyChart? No  Agent: Please be advised that Rx refills may take up to 3 business days. We ask that you follow-up with your pharmacy.

## 2023-07-06 ENCOUNTER — Telehealth: Payer: Self-pay

## 2023-07-06 NOTE — Telephone Encounter (Signed)
 Copied from CRM [id number]. Topic: Clinical - Medication Question >> Jul 06, 2023  2:59 PM Adonis Hoot wrote: Reason for CRM: Patient has been scheduled for a medication refill appointment for next week.She would like to know is there any way enough b/p  pills could be sent to pharmacy for her to get through the weekend?

## 2023-07-06 NOTE — Telephone Encounter (Signed)
 Copied from CRM [id number]. Topic: Clinical - Prescription Issue >> Jul 06, 2023 10:33 AM Clydene Darner H wrote: Reason for CRM: Patient called this morning regarding the status of her Lisinopril  prescription. She stated that her preferred pharmacy has faxed multiple refill requests but has not received a response. It has now been over a day, and the patient is currently out of her medication. She also reported experiencing a headache, which she believes may be due to missing her dose.Patient is requesting urgent follow-up regarding the refill.

## 2023-07-09 ENCOUNTER — Other Ambulatory Visit: Payer: Self-pay

## 2023-07-09 MED ORDER — LISINOPRIL-HYDROCHLOROTHIAZIDE 10-12.5 MG PO TABS: 1.0000 | ORAL_TABLET | Freq: Every day | ORAL | 0 refills | Status: DC

## 2023-07-09 NOTE — Telephone Encounter (Signed)
 Spoke to pt.   Inform pt Rx is sent and to keep appt for tomorrow. Pt verbalized understanding. Pt reports her headache has subsided.

## 2023-07-09 NOTE — Telephone Encounter (Signed)
 Spoke to pt. Rx send. Please see other phone encounter.

## 2023-07-10 ENCOUNTER — Ambulatory Visit (INDEPENDENT_AMBULATORY_CARE_PROVIDER_SITE_OTHER): Payer: Self-pay | Admitting: Internal Medicine

## 2023-07-10 ENCOUNTER — Other Ambulatory Visit: Payer: Self-pay | Admitting: Internal Medicine

## 2023-07-10 ENCOUNTER — Encounter: Payer: Self-pay | Admitting: Internal Medicine

## 2023-07-10 VITALS — BP 144/90 | HR 83 | Temp 98.1°F | Ht 62.75 in | Wt 156.2 lb

## 2023-07-10 DIAGNOSIS — Z833 Family history of diabetes mellitus: Secondary | ICD-10-CM

## 2023-07-10 DIAGNOSIS — Z1211 Encounter for screening for malignant neoplasm of colon: Secondary | ICD-10-CM

## 2023-07-10 DIAGNOSIS — Z79899 Other long term (current) drug therapy: Secondary | ICD-10-CM | POA: Diagnosis not present

## 2023-07-10 DIAGNOSIS — F17200 Nicotine dependence, unspecified, uncomplicated: Secondary | ICD-10-CM

## 2023-07-10 DIAGNOSIS — Z8249 Family history of ischemic heart disease and other diseases of the circulatory system: Secondary | ICD-10-CM | POA: Diagnosis not present

## 2023-07-10 DIAGNOSIS — I10 Essential (primary) hypertension: Secondary | ICD-10-CM

## 2023-07-10 MED ORDER — LISINOPRIL-HYDROCHLOROTHIAZIDE 10-12.5 MG PO TABS: 1.5000 | ORAL_TABLET | Freq: Every day | ORAL | 1 refills | Status: DC

## 2023-07-10 NOTE — Progress Notes (Signed)
 Chief Complaint  Patient presents with   Medical Management of Chronic Issues    Pt reports she haven't had BP med for 5 days. Today is first day back on lisinopril -hydrochlorothiazide .   Thigh Discomfort    Pt c/o of soreness on L thigh due to "broken vein". Noticed it 2 wks ago.     HPI: Amanda [name] 68 y.o. come in for Chronic disease management  Last visit was  2 37 with me  has had mom and 2 broth pass since then  [age] heart attacks  Needing bp  meds    HT :   6 mos running high daistolic  high 80s  no se of meds some   took lisinopril  this am  ROS: See pertinent positives and negatives per HPI. Still using 3 tob  per day Sees dr Elene Griffes for sub solv.  Doing well  gets ocass labs  Never received  the cologuard in the mail.  Cares for 5 yo grandchild  Past Medical History:  Diagnosis Date   Endometriosis    hysterectomy nad right oopherectomy   H/O sciatica    left   Infectious mononucleosis hepatitis    Narcotic dependence, in remission (HCC)     Family History  Problem Relation Age of Onset   Diabetes Mother    Heart disease Mother 64       heart attack in 72s    Hyperlipidemia Mother    Hypertension Mother    Hypertension Father    Kidney disease Father    Vascular Disease Father    Diabetes Brother    Crohn's disease Brother    Heart disease Maternal Grandmother 66       MI age 58    Social History   Socioeconomic History   Marital status: Married    Spouse name: Not on file   Number of children: Not on file   Years of education: Not on file   Highest education level: Not on file  Occupational History   Not on file  Tobacco Use   Smoking status: Every Day   Smokeless tobacco: Never   Tobacco comments:    3 cigs daily as of 10/17/17  Vaping Use   Vaping status: Never Used  Substance and Sexual Activity   Alcohol use: Yes   Drug use: Never   Sexual activity: Not on file  Other Topics Concern   Not on file  Social History Narrative   hh  of 3 married     Tobacco    No etoh   3-4 caffiene.   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 30 tablet 0   ZUBSOLV 5.7-1.4 MG SUBL      lisinopril -hydrochlorothiazide  (ZESTORETIC ) 10-12.5 MG tablet Take 1 tablet by mouth daily. 90 tablet 0   No facility-administered medications prior to visit.     EXAM:  BP (!) 144/90 (BP Location: Left Arm, Patient Position: Sitting, Cuff Size: Normal)  Pulse 83   Temp 98.1 F (36.7 C) (Oral)   Ht 5' 2.75" (1.594 m)   Wt 156 lb 3.2 oz (70.9 kg)   SpO2 97%   BMI 27.89 kg/m   Body mass index is 27.89 kg/m.  GENERAL: vitals reviewed and listed above, alert, oriented, appears well hydrated and in no acute distress HEENT: atraumatic, conjunctiva  clear, no obvious abnormalities on inspection of external nose and ears  NECK: no obvious masses on inspection palpation  LUNGS: clear to auscultation bilaterally, no wheezes, rales or rhonchi, good air movement CV: HRRR, no clubbing cyanosis or  peripheral edema nl cap refill  MS: moves all extremities without noticeable focal  abnormality Legs  superficial   spider veins upper thighs no edema or redness or ulceration  oradenopathy PSYCH: pleasant and cooperative, no obvious depression or anxiety Lab Results  Component Value Date   WBC 4.9 10/17/2017   HGB 13.1 10/17/2017   HCT 38.5 10/17/2017   PLT 171.0 10/17/2017   GLUCOSE 117 (H) 10/17/2017   CHOL 201 (H) 10/17/2017   TRIG 72.0 10/17/2017   HDL 55.30 10/17/2017   LDLDIRECT 144.9 01/26/2012    LDLCALC 132 (H) 10/17/2017   ALT 50 (H) 10/17/2017   AST 47 (H) 10/17/2017   NA 139 10/17/2017   K 4.2 10/17/2017   CL 101 10/17/2017   CREATININE 0.70 10/17/2017   BUN 8 10/17/2017   CO2 33 (H) 10/17/2017   TSH 2.01 10/17/2017   HGBA1C 5.9 04/30/2013   BP Readings from Last 3 Encounters:  07/10/23 (!) 144/90  05/04/21 136/82  03/19/20 138/80   Last lab in system 2019  need updating   ASSESSMENT AND PLAN:  Discussed the following assessment and plan:  Hypertension, unspecified type  Medication management  Colon cancer screening - Plan: Cologuard  Family history of heart attack  TOBACCO USER Disc my chart as option to communicate  Increase  medication ( since just got filled) take 1.5  and check  labs after that  Send in readings to make sure at goal  if not can adjust dosing  Encouraged to dc  tobacco Sibs passes from heart disease.  Consider other intervention prevention as indicated  Legs spider veins   reassuring  -Patient advised to return or notify health care team  if  new concerns arise.  Patient Instructions  Good to see you today   Try taking 1.5 of bp med for now since  you have . Make lab appt    for a few weeks  after changing dose of medication .  Will include   chemistry cholesterol and A1c . Test.   Mark Hassey K. Phylis Javed M.D.

## 2023-07-10 NOTE — Patient Instructions (Addendum)
 Good to see you today   Try taking 1.5 of bp med for now since  you have . Make lab appt    for a few weeks  after changing dose of medication .  Will include   chemistry cholesterol and A1c . Test.

## 2023-07-10 NOTE — Progress Notes (Signed)
 Future orders  after dose change

## 2023-08-17 DIAGNOSIS — B029 Zoster without complications: Secondary | ICD-10-CM | POA: Diagnosis not present

## 2023-08-29 ENCOUNTER — Other Ambulatory Visit: Payer: Self-pay | Admitting: Internal Medicine

## 2023-09-18 DIAGNOSIS — F1120 Opioid dependence, uncomplicated: Secondary | ICD-10-CM | POA: Diagnosis not present

## 2023-09-18 DIAGNOSIS — Z79899 Other long term (current) drug therapy: Secondary | ICD-10-CM | POA: Diagnosis not present

## 2023-11-20 NOTE — Progress Notes (Signed)
   11/20/2023  Patient ID: Amanda [name] Corporal, female   DOB: [birth year], 68 y.o.   MRN: [id number]  Pharmacy Quality Measure Review  This patient is appearing on a report for being at risk of failing the adherence measure for hypertension (ACEi/ARB) medications this calendar year.   Medication: Lisinopril -hydrochlorothiazide   Last fill date: 11/03/23 for 90 day supply  Insurance report was not up to date. No action needed at this time.   Jon VEAR Lindau, PharmD Clinical Pharmacist [phone]

## 2023-11-25 DIAGNOSIS — R21 Rash and other nonspecific skin eruption: Secondary | ICD-10-CM | POA: Diagnosis not present

## 2023-12-31 ENCOUNTER — Other Ambulatory Visit: Payer: Self-pay | Admitting: Internal Medicine

## 2023-12-31 NOTE — Telephone Encounter (Signed)
 Please clarify dosage

## 2023-12-31 NOTE — Telephone Encounter (Unsigned)
 Copied from CRM #[id number]. Topic: Clinical - Medication Refill >> Dec 31, 2023  4:41 PM Zebedee SAUNDERS wrote: Medication: lisinopril -hydrochlorothiazide  (ZESTORETIC ) 10-12.5 MG tablet  Has the patient contacted their pharmacy? Yes (Agent: If no, request that the patient contact the pharmacy for the refill. If patient does not wish to contact the pharmacy document the reason why and proceed with request.) (Agent: If yes, when and what did the pharmacy advise?)Pharmacy need new script with increase for 1 1/2 per day.   This is the patient's preferred pharmacy:  [id number] [place], [place] - [address] [place] [address] Phone: [phone] Fax: [phone]  Is this the correct pharmacy for this prescription? Yes If no, delete pharmacy and type the correct one.   Has the prescription been filled recently? Yes  Is the patient out of the medication? Yes  Has the patient been seen for an appointment in the last year OR does the patient have an upcoming appointment? Yes  Can we respond through MyChart? Yes  Agent: Please be advised that Rx refills may take up to 3 business days. We ask that you follow-up with your pharmacy.

## 2024-01-02 MED ORDER — LISINOPRIL-HYDROCHLOROTHIAZIDE 10-12.5 MG PO TABS: 1.0000 | ORAL_TABLET | Freq: Every day | ORAL | 1 refills | Status: AC

## 2024-01-14 DIAGNOSIS — Z79899 Other long term (current) drug therapy: Secondary | ICD-10-CM | POA: Diagnosis not present

## 2024-01-14 DIAGNOSIS — F1120 Opioid dependence, uncomplicated: Secondary | ICD-10-CM | POA: Diagnosis not present

## 2024-01-22 NOTE — Progress Notes (Signed)
   01/22/2024  Patient ID: Amanda [name], female   DOB: 1955-04-25, 68 y.o.   MRN: [id number]  Pharmacy Quality Measure Review  This patient is appearing on a report for being at risk of failing the adherence measure for hypertension (ACEi/ARB) medications this calendar year.   Medication: Lisinopril -hctz Last fill date: 01/02/24 for 90 day supply  Insurance report was not up to date. No action needed at this time.  Fill dates are as follows: 07/09/23 90ds 09/03/23 90ds 11/03/23 90ds 01/02/24 90ds  Jon VEAR Lindau, PharmD Clinical Pharmacist [phone]

## 2024-06-10 ENCOUNTER — Ambulatory Visit
